# Patient Record
Sex: Male | Born: 1980 | Race: Asian | Hispanic: No | Marital: Married | Smoking: Never smoker
Health system: Southern US, Community
[De-identification: ages and names within clinical notes are randomized; demographics above are authoritative.]

## PROBLEM LIST (undated history)

## (undated) ENCOUNTER — Emergency Department (HOSPITAL_COMMUNITY): Admission: EM | Disposition: A | Discharge status: Home / Self Care | Payer: 59 | Source: Home / Self Care

## (undated) DIAGNOSIS — I471 Supraventricular tachycardia, unspecified: Secondary | ICD-10-CM

## (undated) DIAGNOSIS — Z87898 Personal history of other specified conditions: Secondary | ICD-10-CM

## (undated) HISTORY — DX: Personal history of other specified conditions: Z87.898

## (undated) HISTORY — DX: Supraventricular tachycardia: I47.1

## (undated) HISTORY — PX: LUNG SURGERY: SHX703

## (undated) HISTORY — DX: Supraventricular tachycardia, unspecified: I47.10

---

## 2013-09-19 ENCOUNTER — Ambulatory Visit (INDEPENDENT_AMBULATORY_CARE_PROVIDER_SITE_OTHER): Payer: BC Managed Care – PPO | Admitting: Emergency Medicine

## 2013-09-19 VITALS — BP 122/64 | HR 111 | Temp 101.0°F | Resp 16 | Ht 65.5 in | Wt 181.2 lb

## 2013-09-19 DIAGNOSIS — B019 Varicella without complication: Secondary | ICD-10-CM

## 2013-09-19 MED ORDER — VALACYCLOVIR HCL 1 G PO TABS: 1000.0000 mg | ORAL_TABLET | Freq: Three times a day (TID) | ORAL | Status: DC

## 2013-09-19 NOTE — Patient Instructions (Signed)
B?nh Th?y ??u (Chickenpox [Varicella]) B?nh th?y ?u (Varicella) l b?nh nhi?m vi rt, ph? bi?n h?n ? tr? em. B?nh ny c xu h??ng l ?m nh? v?i h?u h?t tr? em kh?e m?nh. N c th? n?ng h?n ?:   Ng??i l?n.  Tr? s? sinh.  Nh?ng ng??i c v?n ?? ? h? th?ng mi?n d?ch.  Ng??i ?ang ?i?u tr? b?nh ung th?. NGUYN NHN Th?y ??u do m?t lo?i vi rt c tn Vi rt Varicella Zoster (VZV) gy ra. VZV gy ra c? hai b?nh th?y ??u v b?nh zona. ? b? th?y ??u, m?t ng??i d? b? nhi?m b?nh (c th? b? nhi?m trng) ph?i ti?p xc v?i m?t ai ? b? th?y ??u ho?c zona. M?t ng??i d? b? nhi?m b?nh n?u:  H? ch?a b? nhi?m b?nh tr??c ?.  H? ch?a ???c ch?ng ng?a ch?ng VZV.  Vi?c ch?ng ng?a khng b?o v? kh?i b? VZV hon ton (th?y ??u ti?n tri?n). Th?y ??u r?t d? ly. N c th? ly nhi?m 1 ??n 2 ngy tr?c khi xu?t hi?n ban. N c?ng c th? ly nhi?m cho ??n khi cc m?n n??c ?ng v?y. M?n n??c th??ng ?ng v?y 3 ?n 7 ngy sau khi pht ban b?t ??u. Th??ng m?t kho?ng 2 tu?n tr??c khi cc tri?u ch?ng xu?t hi?n. TRI?U CH?NG Tri?u ch?ng ?i?n hnh c?a th?y ??u g?m:  S?t.  au ??u.  ?n khng ngon.  Pht ban ng?a thay ??i theo th?i gian:  B?nh b?t ??u b?ng nh?ng ch?m mu ??, sau ? tr? thnh cc c?c.  Cc c?c chuy?n thnh m?n n??c.  M?n n??c chuy?n thnh v?y. Th?y d?u ti?n tri?n x?y ra khi m?t ng??i ? ???c ch?ng ng?a v?n b? b?nh th?y ??u. Tri?u ch?ng t nghim tr?ng h?n. Pht ban c th? ch? l nh?ng ??m ? ho?c c?c, khng c m?n n??c ho?c ?ng v?y. C th? s?t ? nhi?t ?? th?p ho?c khng b? s?t. CH?N ON Th?y ??u ?i?n hnh ???c ch?n ?on b?ng khm th?c th?. M?t xt nghi?m mu c th? xc nh?n ch?n ?on, khi khng ch?c ch?n v? b?nh. I?U TR? a ph?n, ? tr? em kh?e m?nh, ch? c?n ?i?u tr? t?i nh. Trong m?t s? tr??ng h?p, ? giai ?o?n ??u c?a b?nh th?y ??u, chuyn gia chm [place] s?c kh?e c th? k thu?c khng vi rt. Nh?ng lo?i thu?c ny c th? lm gi?m m?c ?? n?ng c?a b?nh v ng?n ng?a bi?n ch?ng. Trong tr??ng h?p ph?c t?p  hi?m g?p, c th? c?n n?m vi?n ?? ?i?u tr?. Thu?c truy?n qua t?nh m?ch (IV) v cc ph??ng php ?i?u tr? khc c th? ???c cho dng trong b?nh vi?n. Chuyn gia ch?m [place] s?c kh?e c th? k ??n thu?c gi?m ng?a. ? ng?n ch?n s? ly lan c?a b?nh th?y ??u sang nh?ng ng??i c nguy c?, chuyn gia ch?m [place] y t? c th? k ??n:  Ch?ng ng?a.  Thu?c khng vi rt.  Globulinmi?n d?ch H??NG D?N CH?M [place] T?I NH  ??i v?i s?t:  Khng cho tr? em dng aspirin. Thu?c ny c th? lm t?n th??ng no v gan qua h?i ch?ng [place]. Hy ??c nhn thu?c khng c?n k toa ???c s? d?ng.  Ch? dng cc thu?c khng c?n k toa ho?c c?n k toa ?? gi?m ?au, gi?m c?m gic kh ch?u, ho?c gi?m s?t theo nh? h??ng d?n c?a chuyn gia ch?m [place] s?c kh?e.  ?i v?i ng?a:  N?u chuyn gia ch?m [place] s?c kh?e k thu?c, hy s?  d?ng thu?c theo ch? d?n.  B?n c th? s? d?ng kem d??ng da [place] thng th??ng trn cc v?t lot c ng?a. Th?c hi?n theo cc h??ng d?n trn nhn thu?c. Khng s? d?ng cho v?t lot trong mi?ng.  Trnh gi ln cc n?t pht ban ho?c bc v?y. C?t ng?n mng tay v gi? mng tay s?ch s?. i g?ng tay b?ng v?i bng ho?c t?t chn vo bn tay c?a con b?n vo ban ?m.  Gi? tr? b? th?y ??u ? n?i yn t?nh v mt m?. M? hi v qu nng lm cho ng?a t?i t? h?n. Trnh xa m?t tr?i.  C th? ch??m mt ln ch? ng?a.  T?m n??c mt c s?a bicacbonat hay x bng b?t y?n m?ch c th? gip c?i thi?n v?n ??.  ?i v?i nh?ng ch? lot trong mi?ng, thu?c gi?m ?au v th?c ?n l?nh, kem tri cy, c th? t?o c?m gic ?? h?n.  U?ng nhi?u n??c. Trnh ?? u?ng m?n ho?c chua (c chua ho?c n??c cam). Nh?ng ch?t ny s? kch thch ch? lot mi?ng v gy ?au.  Ng??i b? th?y ??u nn trnh ti?p xc (? chung phng) v?i:  Ph? n? mang thai (??c bi?t l n?u h? ch?a b? th?y ??u ho?c ch?a ???c ch?ng ng?a th?y ??u).  Tr? s? sinh.  Nh?ng ng??i ?ang ?i?u tr? ung th? ho?c s? d?ng steroid di h?n.  Nh?ng ng??i c v?n ?? v? h? th?ng mi?n d?ch.  Nh?ng ng??i cao  tu?i.  B?t k? tr? em ho?c ng??i l?n no b? th?y ??u c?ng ??u nn ? nh cho ??n khi t?t c? m?n n??c ? ?ng v?y. N?u khng c m?n n??c, tr? em ho?c ng??i l?n nn ? nh cho ??n khi khng c cc ??m m?i xu?t hi?n. HY ?I KHM N?U:  Nhi?t ?? ?o ? mi?ng trn 38,9 C (102 F).  Tr? h?n 3 thng tu?i c nhi?t ?? ?o ? h?u mn l 100,5 F (38,1 C) tr? ln trong h?n 1 ngy.  Cc v?t lot b? nhi?m trng. Tm ch? b?:  S?ng t?y.  T?y ?? nhi?u h?n.  S?c ??.  Nh?y c?m ?au.  M? mu vng ho?c mu xanh l cy ch?y ra t? m?n n??c.  Ho.  Cc tri?u ch?ng m?i pht tri?n khi?n b?n c?m th?y lo ng?i. HY ?I KHM B?NH NGAY L?P T?C N?U: B?n ho?c con b?n b?:  Nn.  L l?n, bu?n ng? b?t th??ng ho?c c hnh vi l?.  C?ng c?.  ?ng kinh (co gi?t).  M?t th?ng b?ng.  au ng?c.  Kh th? ho?c th? nhanh.  Mu trong n??c ti?u.  Ch?y mu tr?c trng.  B?m tm ? da ho?c ch?y mu trong cc m?n n??c.  M?n n??c trong m?t.  au m?t, t?y ?? ho?c gi?m th? l?c.  Nhi?t ?? ?o ? mi?ng trn 38,9 C (102 F), khng gi?m sau khi dng thu?c.  Tr? h?n 3 thng tu?i c nhi?t ?? ?o ? h?u mn l 102 F (38,9 C) tr? ln.  Tr? 3 thng tu?i ho?c nh? h?n c nhi?t ?? ?o ? h?u mn l 100,4 F (38 C) tr? ln. HY CH?C CH?N R?NG B?N:  Hi?u r nh?ng h??ng d?n khi xu?t vi?n.  S? theo di tnh tr?ng b?nh c?a b?n.  S? yu c?u tr? gip n?u b?n khng ?? ho?c tnh tr?ng tr?m tr?ng h?n. Document Released: 04/22/2005 Document Revised: 03/15/2013 [facility] Patient Information 2014 [place], [place].

## 2013-09-19 NOTE — Progress Notes (Signed)
[facility] [address], [place] [id number] [phone]  Date:  09/19/2013   Name:  Richard Meza   DOB:  [birth year]   MRN:  [id number]  PCP:  No primary provider on file.    Chief Complaint: Rash and Fever   History of Present Illness:  Richard Dalia HeadingBuon Meza is a 33 y.o. very pleasant male patient who presents with the following:  Ill since yesterday with fever and headache.  Has rash that has developed today on the trunk and spread to the upper extremities.  Is pruritic.  No cough or coryza.  No nausea or vomiting.  No stool change.  No improvement with over the counter medications or other home remedies. Denies other complaint or health concern today.   There are no active problems to display for this patient.   No past medical history on file.  No past surgical history on file.  History  Substance Use Topics  . Smoking status: Never Smoker   . Smokeless tobacco: Not on file  . Alcohol Use: Not on file    No family history on file.  No Known Allergies  Medication list has been reviewed and updated.  No current outpatient prescriptions on file prior to visit.   No current facility-administered medications on file prior to visit.    Review of Systems:  As per HPI, otherwise negative.    Physical Examination: Filed Vitals:   09/19/13 1010  BP: 122/64  Pulse: 111  Temp: 101 F (38.3 C)  Resp: 16   Filed Vitals:   09/19/13 1010  Height: 5' 5.5" (1.664 m)  Weight: 181 lb 3.2 oz (82.192 kg)   Body mass index is 29.68 kg/(m^2). Ideal Body Weight: Weight in (lb) to have BMI = 25: 152.2  GEN: WDWN, NAD, Non-toxic, A & O x 3 HEENT: Atraumatic, Normocephalic. Neck supple. No masses, No LAD. Ears and Nose: No external deformity. CV: RRR, No M/G/R. No JVD. No thrill. No extra heart sounds. PULM: CTA B, no wheezes, crackles, rhonchi. No retractions. No resp. distress. No accessory muscle use. ABD: S, NT, ND, +BS. No rebound. No HSM. EXTR: No  c/c/e NEURO Normal gait.  PSYCH: Normally interactive. Conversant. Not depressed or anxious appearing.  Calm demeanor.  SKIN:  Erythematous rash disseminated on upper extremities and trunk with two apparent generations of lesions; erythematous papules and some vesicles with central umbilications.    Assessment and Plan: Varicella Valtrex NO ASPIRIN  Signed,  Phillips OdorJeffery Keishawn Darsey, MD

## 2013-09-24 ENCOUNTER — Other Ambulatory Visit: Payer: Self-pay | Admitting: Emergency Medicine

## 2013-09-28 ENCOUNTER — Ambulatory Visit (INDEPENDENT_AMBULATORY_CARE_PROVIDER_SITE_OTHER): Payer: BC Managed Care – PPO | Admitting: Family Medicine

## 2013-09-28 VITALS — BP 124/60 | HR 61 | Temp 97.6°F | Resp 16 | Ht 66.0 in | Wt 182.4 lb

## 2013-09-28 DIAGNOSIS — R21 Rash and other nonspecific skin eruption: Secondary | ICD-10-CM

## 2013-09-28 NOTE — Progress Notes (Signed)
Subjective: 33 year old man who is here with a rash last week it was diagnosed as being chickenpox. [place] He works for [employer]. Although he been told he could return to work and was okay with his boss, his HR person wanted him to have complete clearance before he could return. He is doing much better and the rash has improved considerably with mild itching now. He did have initial fever with it apparently. Dr. Dareen PianoAnderson had seen him and diagnosed to chickenpox.  Objective: He has a residual of the rash on his truck. He has multiple little excoriated areas. He apparently had some on his legs. He had a moderate number on his arms. The scars did not look like the classic signs of chickenpox, but it certainly sounds like he may of had a true chickenpox because of the way they're described. He has some dry scabs still present, very small, many on the trunk, all under her his clothing.  Assessment: Rash from chickenpox, resolved  Plan: Letter of return to work.

## 2013-09-28 NOTE — Patient Instructions (Signed)
Return to regular duty.  If any rash persists please return

## 2013-10-10 ENCOUNTER — Ambulatory Visit: Payer: BC Managed Care – PPO | Admitting: Family Medicine

## 2013-10-10 VITALS — BP 122/68 | HR 86 | Temp 97.9°F | Resp 18 | Ht 65.0 in | Wt 181.2 lb

## 2013-10-10 DIAGNOSIS — H1045 Other chronic allergic conjunctivitis: Secondary | ICD-10-CM

## 2013-10-10 DIAGNOSIS — H1010 Acute atopic conjunctivitis, unspecified eye: Secondary | ICD-10-CM

## 2013-10-10 MED ORDER — OLOPATADINE HCL 0.1 % OP SOLN
1.0000 [drp] | Freq: Two times a day (BID) | OPHTHALMIC | Status: DC
Start: 2013-10-10 — End: 2014-09-19

## 2013-10-10 NOTE — Patient Instructions (Signed)
Sometimes, alavert or zyrtec helps as well   Allergic Conjunctivitis The conjunctiva is a thin membrane that covers the visible white part of the eyeball and the underside of the eyelids. This membrane protects and lubricates the eye. The membrane has small blood vessels running through it that can normally be seen. When the conjunctiva becomes inflamed, the condition is called conjunctivitis. In response to the inflammation, the conjunctival blood vessels become swollen. The swelling results in redness in the normally white part of the eye. The blood vessels of this membrane also react when a person has allergies and is then called allergic conjunctivitis. This condition usually lasts for as long as the allergy persists. Allergic conjunctivitis cannot be passed to another person (non-contagious). The likelihood of bacterial infection is great and the cause is not likely due to allergies if the inflamed eye has:  A sticky discharge.  Discharge or sticking together of the lids in the morning.  Scaling or flaking of the eyelids where the eyelashes come out.  Red swollen eyelids. CAUSES   Viruses.  Irritants such as foreign bodies.  Chemicals.  General allergic reactions.  Inflammation or serious diseases in the inside or the outside of the eye or the orbit (the boney cavity in which the eye sits) can cause a "red eye." SYMPTOMS   Eye redness.  Tearing.  Itchy eyes.  Burning feeling in the eyes.  Clear drainage from the eye.  Allergic reaction due to pollens or ragweed sensitivity. Seasonal allergic conjunctivitis is frequent in the spring when pollens are in the air and in the fall. DIAGNOSIS  This condition, in its many forms, is usually diagnosed based on the history and an ophthalmological exam. It usually involves both eyes. If your eyes react at the same time every year, allergies may be the cause. While most "red eyes" are due to allergy or an infection, the role of an eye  (ophthalmological) exam is important. The exam can rule out serious diseases of the eye or orbit. TREATMENT   Non-antibiotic eye drops, ointments, or medications by mouth may be prescribed if the ophthalmologist is sure the conjunctivitis is due to allergies alone.  Over-the-counter drops and ointments for allergic symptoms should be used only after other causes of conjunctivitis have been ruled out, or as your caregiver suggests. Medications by mouth are often prescribed if other allergy-related symptoms are present. If the ophthalmologist is sure that the conjunctivitis is due to allergies alone, treatment is normally limited to drops or ointments to reduce itching and burning. HOME CARE INSTRUCTIONS   Wash hands before and after applying drops or ointments, or touching the inflamed eye(s) or eyelids.  Do not let the eye dropper tip or ointment tube touch the eyelid when putting medicine in your eye.  Stop using your soft contact lenses and throw them away. Use a new pair of lenses when recovery is complete. You should run through sterilizing cycles at least three times before use after complete recovery if the old soft contact lenses are to be used. Hard contact lenses should be stopped. They need to be thoroughly sterilized before use after recovery.  Itching and burning eyes due to allergies is often relieved by using a cool cloth applied to closed eye(s). SEEK MEDICAL CARE IF:   Your problems do not go away after two or three days of treatment.  Your lids are sticky (especially in the morning when you wake up) or stick together.  Discharge develops. Antibiotics may be needed  either as drops, ointment, or by mouth.  You have extreme light sensitivity.  An oral temperature above 102 F (38.9 C) develops.  Pain in or around the eye or any other visual symptom develops. MAKE SURE YOU:   Understand these instructions.  Will watch your condition.  Will get help right away if you  are not doing well or get worse. Document Released: 10/03/2002 Document Revised: 10/05/2011 Document Reviewed: 08/29/2007 [facility] Patient Information 2014 [place]ExitCare, [place]LLC.

## 2013-10-10 NOTE — Progress Notes (Signed)
A 33 year old man who works for [employer] and has a recurrence of itchy reddened eyes the last week. At the same thing the last couple years at this time of year. He's had no discharge or pain, vision is slightly blurry.  Objective: No acute distress EOMI PERRLA  Eyes injected with normal fundi, normal lids which are without exudate or significant swelling.  Assessment: Symptoms and history are consistent with allergic conjunctivitis.  Plan:Conjunctivitis, allergic - Plan: olopatadine (PATANOL) 0.1 % ophthalmic solution  Signed, Elvina SidleKurt Indalecio Malmstrom, MD

## 2013-10-17 ENCOUNTER — Telehealth: Payer: Self-pay | Admitting: Emergency Medicine

## 2013-10-17 NOTE — Telephone Encounter (Signed)
Patient dropped off FMLA forms on 10/17/2013. I spoke with patient to see what all is needed; placed forms in Dr. Ewell PoeAnderson's box on same day.

## 2013-10-25 NOTE — Telephone Encounter (Signed)
Patient called to check the status of his FMLA ppw. See previous message.

## 2014-09-09 ENCOUNTER — Emergency Department (HOSPITAL_COMMUNITY)
Admission: EM | Admit: 2014-09-09 | Discharge: 2014-09-09 | Disposition: A | Discharge status: Home / Self Care | Payer: BLUE CROSS/BLUE SHIELD | Attending: Emergency Medicine | Admitting: Emergency Medicine

## 2014-09-09 ENCOUNTER — Encounter (HOSPITAL_COMMUNITY): Payer: Self-pay | Admitting: Emergency Medicine

## 2014-09-09 ENCOUNTER — Emergency Department (HOSPITAL_COMMUNITY): Payer: BLUE CROSS/BLUE SHIELD

## 2014-09-09 DIAGNOSIS — R002 Palpitations: Secondary | ICD-10-CM | POA: Diagnosis not present

## 2014-09-09 DIAGNOSIS — I440 Atrioventricular block, first degree: Secondary | ICD-10-CM

## 2014-09-09 DIAGNOSIS — R000 Tachycardia, unspecified: Secondary | ICD-10-CM | POA: Insufficient documentation

## 2014-09-09 DIAGNOSIS — R0789 Other chest pain: Secondary | ICD-10-CM | POA: Insufficient documentation

## 2014-09-09 DIAGNOSIS — R7989 Other specified abnormal findings of blood chemistry: Secondary | ICD-10-CM

## 2014-09-09 DIAGNOSIS — R079 Chest pain, unspecified: Secondary | ICD-10-CM | POA: Diagnosis present

## 2014-09-09 DIAGNOSIS — R791 Abnormal coagulation profile: Secondary | ICD-10-CM | POA: Diagnosis not present

## 2014-09-09 LAB — CBC
HCT: 45.3 % (ref 39.0–52.0)
Hemoglobin: 15 g/dL (ref 13.0–17.0)
MCH: 24.1 pg — AB (ref 26.0–34.0)
MCHC: 33.1 g/dL (ref 30.0–36.0)
MCV: 72.8 fL — ABNORMAL LOW (ref 78.0–100.0)
Platelets: 329 10*3/uL (ref 150–400)
RBC: 6.22 MIL/uL — ABNORMAL HIGH (ref 4.22–5.81)
RDW: 13.5 % (ref 11.5–15.5)
WBC: 10.1 10*3/uL (ref 4.0–10.5)

## 2014-09-09 LAB — I-STAT TROPONIN, ED: TROPONIN I, POC: 0 ng/mL (ref 0.00–0.08)

## 2014-09-09 LAB — D-DIMER, QUANTITATIVE: D-Dimer, Quant: 0.72 ug/mL-FEU — ABNORMAL HIGH (ref 0.00–0.48)

## 2014-09-09 LAB — BASIC METABOLIC PANEL
Anion gap: 7 (ref 5–15)
BUN: 16 mg/dL (ref 6–23)
CHLORIDE: 105 mmol/L (ref 96–112)
CO2: 25 mmol/L (ref 19–32)
Calcium: 9.6 mg/dL (ref 8.4–10.5)
Creatinine, Ser: 1.08 mg/dL (ref 0.50–1.35)
GFR calc Af Amer: >90 mL/min (ref >90)
GFR, EST NON AFRICAN AMERICAN: 88 mL/min — AB (ref >90)
GLUCOSE: 101 mg/dL — AB (ref 70–99)
Potassium: 4.3 mmol/L (ref 3.5–5.1)
SODIUM: 137 mmol/L (ref 135–145)

## 2014-09-09 MED ORDER — SODIUM CHLORIDE 0.9 % IV BOLUS (SEPSIS)
1000.0000 mL | Freq: Once | INTRAVENOUS | Status: AC
  Administered 2014-09-09: 1000 mL via INTRAVENOUS

## 2014-09-09 MED ORDER — IOHEXOL 350 MG/ML SOLN
80.0000 mL | Freq: Once | INTRAVENOUS | Status: AC | PRN
  Administered 2014-09-09: 64 mL via INTRAVENOUS

## 2014-09-09 MED ORDER — ADENOSINE 6 MG/2ML IV SOLN
12.0000 mg | Freq: Once | INTRAVENOUS | Status: DC
  Filled 2014-09-09: qty 4

## 2014-09-09 MED ORDER — ASPIRIN 81 MG PO CHEW
324.0000 mg | CHEWABLE_TABLET | Freq: Once | ORAL | Status: AC
  Administered 2014-09-09: 324 mg via ORAL
  Filled 2014-09-09: qty 4

## 2014-09-09 NOTE — ED Notes (Signed)
Pt c/o chest pain onset Thursday with shortness of breath and diaphoresis. [facility] and sent here for further eval.

## 2014-09-09 NOTE — Consult Note (Signed)
CARDIOLOGY CONSULT NOTE  Patient ID: Richard Meza, MRN: [id number], DOB/AGE: November 23, 1980 34 y.o. Admit date: 09/09/2014 Date of Consult: 09/09/2014  Primary Physician: No primary care provider on file. Primary Cardiologist: unassigned  Chief Complaint: shortness of breath Reason for Consultation: arrhythmia  HPI: 34 y.o. male w/ PMHx significant for history of what sounds like a traumatic pleural effusion s/p thoracentesis/thoracotomy 2006 but otherwise healthy [place] male who presented to [facility] on 09/09[facility]2014 with complaints of several episodes of acute shortness of breath. No recent health issues or illnesses. This past Thursday, he noted acute onset of shortness of breath and dyspnea that caused him to take shallow breathes. Occurred while he was getting up and walking to fridge. He doesn't really endorse chest pain; the shortness of breath and difficulty taking full breaths was most notable. No syncope or presyncope. This was his longest episode and it resolved after abuot 10 minutes. Didn't really notice his heart beating fast. He tried drinking water, soda, and lemon juice in an effort to help. Did not try or know to try vagal manuevers. Since then, has had several more episodes, lasting anywhere from 1-5 minutes, self resolve on their own. Again, no syncope, presyncope.    Never had heart problems in the past. About 10 yrs ago in [place] underwent thoracentesis for what he says was a traumatic injury to his chest resulting in a thoracentesis (clear fluid though but this failed) and then had to have a thoracotomy and has the scar to prove it.  Went to [place] urgent care today and had a episode while on monitor, was going 150 bpm. Transferred to [place] and had narrow complex tachycardia at 135 on EKG. Appears to have P wave buried in ST wave, consistent with short RP. On telemetry, his heart rate slowly improved (over 10 minutes or so) to current rate of 80s. No intervention  except 1 L NS.   Current EKG shows a P wave consistent with sinus though with PR interval of 285 msec. Prominent R wave in V2 with a RSR' morphology but no ST segment elevation.   Currently asymptomatic. Denies any LE swelling, PND, orthopnea. No history of heart problems as child. No illicits or OTC meds. Works as Production designer, theatre/television/film man at [employer]   Had elevated D-dimer which led to a CT scan which demonstrated no PE, no dissection and borderline large LV.    Surgical History:  Past Surgical History  Procedure Laterality Date  . Lung surgery     ?traumatic pleural effusion s/p thoracotomy ~2005  Home Meds: Prior to Admission medications   Not on File    Allergies: No Known Allergies  History   Social History  . Marital Status: Married    Spouse Name: N/A  . Number of Children: N/A  . Years of Education: N/A   Occupational History  . Not on file.   Social History Main Topics  . Smoking status: Never Smoker   . Smokeless tobacco: Not on file  . Alcohol Use: No  . Drug Use: No  . Sexual Activity: Not on file   Other Topics Concern  . Not on file   Social History Narrative  . No narrative on file     No family history on file.   Review of Systems: General: negative for chills, fever, night sweats or weight changes.  Cardiovascular: see HPI Dermatological: negative for rash Respiratory: negative for cough or wheezing Urologic: negative for hematuria Abdominal: negative for nausea, vomiting, diarrhea,  bright red blood per rectum, melena, or hematemesis Neurologic: negative for visual changes, syncope, or dizziness All other systems reviewed and are otherwise negative except as noted above.  Labs: No results for input(s): CKTOTAL, CKMB, TROPONINI in the last 72 hours. Lab Results  Component Value Date   WBC 10.1 09/09/2014   HGB 15.0 09/09/2014   HCT 45.3 09/09/2014   MCV 72.8* 09/09/2014   PLT 329 09/09/2014    Recent Labs Lab 09/09/14 1521  NA 137   K 4.3  CL 105  CO2 25  BUN 16  CREATININE 1.08  CALCIUM 9.6  GLUCOSE 101*   No results found for: CHOL, HDL, LDLCALC, TRIG Lab Results  Component Value Date   DDIMER 0.72* 09/09/2014    Radiology/Studies:  Ct Angio Chest Pe W/cm &/or Wo Cm  09/09/2014   CLINICAL DATA:  Chest pain for 4 days with shortness of breath and diaphoresis.  EXAM: CT ANGIOGRAPHY CHEST WITH CONTRAST  TECHNIQUE: Multidetector CT imaging of the chest was performed using the standard protocol during bolus administration of intravenous contrast. Multiplanar CT image reconstructions and MIPs were obtained to evaluate the vascular anatomy.  CONTRAST:  64 mL OMNIPAQUE IOHEXOL 350 MG/ML SOLN  COMPARISON:  None.  FINDINGS: No pulmonary embolus is identified. No pleural or pericardial effusion. There is no axillary, hilar or mediastinal lymphadenopathy. Heart size appears enlarged. The lungs are clear. Visualized upper abdomen is unremarkable. No focal bony abnormality is identified.  Review of the MIP images confirms the above findings.  IMPRESSION: Negative for pulmonary embolus or acute disease.  Mild cardiomegaly.   Electronically Signed   By: Drusilla Kanner M.D.   On: 09/09/2014 18:37    EKG: see HPI for description  Physical Exam: Blood pressure 102/60, pulse 99, temperature 98.5 F (36.9 C), temperature source Oral, resp. rate 20, height  (1.651 m), weight 83.915 kg (185 lb), SpO2 97 %. General: Well developed, well nourished, in no acute distress. Head: Normocephalic, atraumatic, sclera non-icteric, no xanthomas, nares are without discharge.  Neck: Supple. Negative for carotid bruits. JVD not elevated. Lungs: Clear bilaterally to auscultation without wheezes, rales, or rhonchi. Breathing is unlabored. Heart: RRR with S1 S2. No murmurs, rubs, or gallops appreciated. Abdomen: Soft, non-tender, non-distended with normoactive bowel sounds. No hepatomegaly. No rebound/guarding. No obvious abdominal masses. Msk:   Strength and tone appear normal for age. Extremities: No clubbing or cyanosis. No edema.  Distal pedal pulses are 2+ and equal bilaterally. Neuro: Alert and oriented X 3. Moves all extremities spontaneously. Psych:  Responds to questions appropriately with a normal affect.    Problem List 1. Narrow complex tachycardia, self resolved to 1st deg AVB 2. Borderline LV enlargement on CT   Assessment and Plan:  34 y.o. male w/ PMHx significant for history of what sounds like a traumatic pleural effusion s/p thoracentesis/thoracotomy 2006 but otherwise healthy [place] male who presented to [facility] on 09/09/2014 with complaints of several episodes of acute shortness of breath, found to be in narrow complex tachycardia that resolved without intervention.  Interesting case for several reasons: he describes sudden onset of symptoms with subsequent relief of symptoms after short period of time. This is c/w with your typical "SVT" of AVNRT or AVRT but telemetry demonstrates more of a gradual resolution of tachycardia and his final EKG demonstrates a rather significant 1st deg AVB (and the P waves appear consistent with sinus origin). This gradual resolution suggests more of a sinus tach or atrial tach  instead of AVNRT or AVRT.   Encouragingly, he is not having any red flag symptoms of chest pain (or evidence of ischemia by EKG or troponin), no CHF symptoms (noted borderline LV enlargement on echo,, no syncope or presyncope and the symptoms self resolve after short period. Though he requires further workup (echo mainly at this time) and potential treatment, this can be deferred to the outpatient clinic and we will try and arrange this for this week. Indications to urgently return were discussed with the patient and his wife who verbalized understanding. Also recommended avoiding driving until seen in clinic.  Due to the prolonged PR, will avoid treating with nodal blockers at this time.   Spoke  with ER staff (and EP by phone) who agreed with plan.  Signed, Adolm JosephWHITLOCK, Troy SineMATTHEW C. MD 09/09/2014, 8:20 PM

## 2014-09-09 NOTE — Discharge Instructions (Signed)
1. Medications: usual home medications 2. Treatment: rest, drink plenty of fluids, no driving until evaluated by cardiology who will call you to scheduled an appointment 3. Follow Up: Please followup with cardiology as directed; Please return to the ER for syncope, worsening chest pain or other concerns

## 2014-09-09 NOTE — ED Provider Notes (Signed)
Complains of anterior chest pain left-sided parasternal nonradiating onset 3 days ago last 10 minutes of the time not made worse or made better by anything presently pain free denies shortness of breath no nausea no sweatiness no treatment prior to coming here. On exam no distress lungs clear to auscultation heart tachycardic regular rhythm abdomen nondistended nontender extremities without edema  Doug SouSam Cierah Crader, MD 09/10/14 [id number]

## 2014-09-09 NOTE — ED Notes (Signed)
Cardiology at bedside.

## 2014-09-09 NOTE — ED Provider Notes (Signed)
CSN: [id number]     Arrival date & time 09/09/14  1515 History   First MD Initiated Contact with Patient 09/09/14 1527     Chief Complaint  Patient presents with  . Chest Pain     (Consider location/radiation/quality/duration/timing/severity/associated sxs/prior Treatment) HPI   Richard Meza is a 34 y.o. male  with no major medical history presents to the Emergency Department complaining of waxing and waning substernal chest pain beginning 3 days ago.  Patient reports that severe episodes occur 2-3 times per day lasting 1-5 minutes with radiation to the left shoulder and back. No aggravating or alleviating factors noted. He reports that during this time he has palpitations. At this moment he has chest pain without palpitations, nausea, diaphoresis.   No treatments prior to arrival.  Patient denies recent travel or recent surgery.   Patient denies associated shortness of breath, fevers, changes in skin or hair. He denies usage, smoking and drug usage. He reports minimal caffeine usage and did not drink any coffee today. He denies diagnosis of thyroid problems.   History reviewed. No pertinent past medical history. Past Surgical History  Procedure Laterality Date  . Lung surgery     No family history on file. History  Substance Use Topics  . Smoking status: Never Smoker   . Smokeless tobacco: Not on file  . Alcohol Use: No    Review of Systems  Constitutional: Negative for fever, diaphoresis, appetite change, fatigue and unexpected weight change.  HENT: Negative for mouth sores.   Eyes: Negative for visual disturbance.  Respiratory: Negative for cough, chest tightness, shortness of breath and wheezing.   Cardiovascular: Positive for chest pain and palpitations.  Gastrointestinal: Negative for nausea, vomiting, abdominal pain, diarrhea and constipation.  Endocrine: Negative for polydipsia, polyphagia and polyuria.  Genitourinary: Negative for dysuria, urgency, frequency and hematuria.   Musculoskeletal: Negative for back pain and neck stiffness.  Skin: Negative for rash.  Allergic/Immunologic: Negative for immunocompromised state.  Neurological: Negative for syncope, light-headedness and headaches.  Hematological: Does not bruise/bleed easily.  Psychiatric/Behavioral: Negative for sleep disturbance. The patient is not nervous/anxious.       Allergies  Review of patient's allergies indicates no known allergies.  Home Medications   Prior to Admission medications   Not on File   BP 102/60 mmHg  Pulse 99  Temp(Src) 98.5 F (36.9 C) (Oral)  Resp 20  Ht  (1.651 m)  Wt 185 lb (83.915 kg)  BMI 30.79 kg/m2  SpO2 97% Physical Exam  Constitutional: He appears well-developed and well-nourished. No distress.  Awake, alert, nontoxic appearance  HENT:  Head: Normocephalic and atraumatic.  Mouth/Throat: Oropharynx is clear and moist. No oropharyngeal exudate.  Eyes: Conjunctivae are normal. No scleral icterus.  Neck: Normal range of motion and full passive range of motion without pain. Neck supple. No thyroid mass and no thyromegaly present.  No thyromegaly or mass  Cardiovascular: Regular rhythm, normal heart sounds and intact distal pulses.   No murmur heard. Tachycardia  Pulmonary/Chest: Effort normal and breath sounds normal. No respiratory distress. He has no wheezes. He exhibits no tenderness.  Equal chest expansion  Abdominal: Soft. Bowel sounds are normal. He exhibits no mass. There is no tenderness. There is no rebound and no guarding.  abd soft and nontender  Musculoskeletal: Normal range of motion. He exhibits no edema or tenderness.  No edema of the lower legs  Neurological: He is alert. Coordination normal.  Speech is clear and goal oriented Moves extremities without  ataxia  Skin: Skin is warm and dry. He is not diaphoretic. No erythema.  Psychiatric: He has a normal mood and affect.  Nursing note and vitals reviewed.   ED Course  Procedures  (including critical care time) Labs Review Labs Reviewed  CBC - Abnormal; Notable for the following:    RBC 6.22 (*)    MCV 72.8 (*)    MCH 24.1 (*)    All other components within normal limits  BASIC METABOLIC PANEL - Abnormal; Notable for the following:    Glucose, Bld 101 (*)    GFR calc non Af Amer 88 (*)    All other components within normal limits  D-DIMER, QUANTITATIVE - Abnormal; Notable for the following:    D-Dimer, Quant 0.72 (*)    All other components within normal limits  I-STAT TROPOININ, ED    Imaging Review Ct Angio Chest Pe W/cm &/or Wo Cm  09/09/2014   CLINICAL DATA:  Chest pain for 4 days with shortness of breath and diaphoresis.  EXAM: CT ANGIOGRAPHY CHEST WITH CONTRAST  TECHNIQUE: Multidetector CT imaging of the chest was performed using the standard protocol during bolus administration of intravenous contrast. Multiplanar CT image reconstructions and MIPs were obtained to evaluate the vascular anatomy.  CONTRAST:  64 mL OMNIPAQUE IOHEXOL 350 MG/ML SOLN  COMPARISON:  None.  FINDINGS: No pulmonary embolus is identified. No pleural or pericardial effusion. There is no axillary, hilar or mediastinal lymphadenopathy. Heart size appears enlarged. The lungs are clear. Visualized upper abdomen is unremarkable. No focal bony abnormality is identified.  Review of the MIP images confirms the above findings.  IMPRESSION: Negative for pulmonary embolus or acute disease.  Mild cardiomegaly.   Electronically Signed   By: Drusilla Kanner M.D.   On: 09/09/2014 18:37     EKG Interpretation   Date/Time:  Sunday September 09 2014 15:19:29 EST Ventricular Rate:  135 PR Interval:    QRS Duration: 102 QT Interval:  312 QTC Calculation: 468 R Axis:   53 Text Interpretation:  Supraventricular tachycardia Otherwise normal ECG No  old tracing to compare Confirmed by Ethelda Chick  MD, SAM [phone]) on  09/09/2014 3:24:20 PM      EKG Interpretation  Date/Time:  Sunday September 09 2014  16:34:33 EST Ventricular Rate:  107 PR Interval:    QRS Duration: 103 QT Interval:  357 QTC Calculation: 476 R Axis:   23 Text Interpretation:  Age not entered, assumed to be  34 years old for purpose of ECG interpretation Junctional tachycardia Consider inferior infarct Baseline wander in lead(s) V5 Since last tracing rate slower Confirmed by Ethelda Chick  MD, SAM [phone]) on 09/09/2014 4:44:10 PM        MDM   Final diagnoses:  Chest pain  Tachycardia  Elevated d-dimer  Prolonged P-R interval   Rivka Spring presents from [facility] with tachycardia and chest pain.  She arrives with a heart rate of 136.  No risk factors for DVT or PE, will check d-dimer. Patient given aspirin and labs pending. Will give adenosine.  3:59 PM Recheck and pt with HR of 116.    The patient was discussed with and seen by Dr. Ethelda Chick who agrees with the treatment plan.  4:45 PM   Repeat EKG with improved heart rate but evidence of accelerated junctional rhythm. Pending d-dimer.  5:05PM Positive d-dimer, will obtain CT angio chest.    6:45 PM CT chest without evidence of primary embolism. Patient now with heart rate in the 90s.  EKG now with first-degree AV block. Will consult cardiology.  7:05 PM Patient remains chest pain-free. Discussed with Dr. Adolm JosephWhitlock of cardiology who will assess. Also discussed with Dr. Drusilla Kannerhomas Dalessio  of radiology who confirms that there is no evidence of aortic dissection on his CT NG of chest.  9:05 PM Patient evaluated by Dr. Adolm JosephWhitlock of cardiology who recommends EP evaluation within the next 3 days. No medications to go home with. Patient is to not drive until evaluated.  He is to return for worsening chest pain, syncope or other concerning factors.  I have personally reviewed patient's vitals, nursing note and any pertinent labs or imaging.  I performed an undressed physical exam.    It has been determined that no acute conditions requiring further emergency  intervention are present at this time. The patient/guardian have been advised of the diagnosis and plan. I reviewed all labs and imaging including any potential incidental findings. We have discussed signs and symptoms that warrant return to the ED and they are listed in the discharge instructions.    Vital signs are stable at discharge.   BP 102/60 mmHg  Pulse 99  Temp(Src) 98.5 F (36.9 C) (Oral)  Resp 20  Ht 5\' 5"  (1.651 m)  Wt 185 lb (83.915 kg)  BMI 30.79 kg/m2  SpO2 97%  The patient was discussed with and seen by Dr. Ethelda ChickJacubowitz who agrees with the treatment plan.   Dahlia ClientHannah Latima Hamza, PA-C 09/09/14 2107  Doug SouSam Jacubowitz, MD 09/10/14 [phone]

## 2014-09-09 NOTE — ED Notes (Addendum)
Pt [facility]  Pt reports the pain has been intermittent since Thursday but when the pain hits he becomes very SHOB and diaphoretic.  Pt reports episodes occur "several times a day"  Pt denies any cardiac history.  Pt is c/o neck pain as well.

## 2014-09-19 ENCOUNTER — Ambulatory Visit (INDEPENDENT_AMBULATORY_CARE_PROVIDER_SITE_OTHER): Payer: BLUE CROSS/BLUE SHIELD | Admitting: Internal Medicine

## 2014-09-19 ENCOUNTER — Encounter: Payer: Self-pay | Admitting: Internal Medicine

## 2014-09-19 VITALS — BP 108/70 | HR 81 | Ht 65.0 in | Wt 188.6 lb

## 2014-09-19 DIAGNOSIS — R011 Cardiac murmur, unspecified: Secondary | ICD-10-CM

## 2014-09-19 DIAGNOSIS — R079 Chest pain, unspecified: Secondary | ICD-10-CM

## 2014-09-19 DIAGNOSIS — I471 Supraventricular tachycardia: Secondary | ICD-10-CM

## 2014-09-19 DIAGNOSIS — R0602 Shortness of breath: Secondary | ICD-10-CM

## 2014-09-19 DIAGNOSIS — E785 Hyperlipidemia, unspecified: Secondary | ICD-10-CM

## 2014-09-19 NOTE — Patient Instructions (Signed)
Your physician recommends that you schedule a follow-up appointment as needed ----will call with results of test   Your physician has requested that you have an exercise tolerance test. For further information please visit [email or web address]. Please also follow instruction sheet, as given.  Your physician has requested that you have an echocardiogram. Echocardiography is a painless test that uses sound waves to create images of your heart. It provides your doctor with information about the size and shape of your heart and how well your heart's chambers and valves are working. This procedure takes approximately one hour. There are no restrictions for this procedure.  Your physician recommends that you return for lab work day of test: Fasting lipid

## 2014-09-19 NOTE — Progress Notes (Signed)
Electrophysiology Office Note   Date:  09/19/2014   ID:  Khameron, Gruenwald [birth year], MRN [id number]  PCP:  none Cardiologist:  none Primary Electrophysiologist: Hillis Range, MD    Chief Complaint  Patient presents with  . Appointment    tachycardia     History of Present Illness: Richard Meza is a 34 y.o. male who presents today for electrophysiology evaluation.   He developed symptoms of chest heaviness and palpitations 09/09/14 for which he presented to [facility] ER.  He was found to have a mid RP tachycardia at that time.  His symptoms resolved in the ER and have not returned.  He has done well since.  Today, he denies symptoms of palpitations, chest pain, shortness of breath, orthopnea, PND, lower extremity edema, claudication, dizziness, presyncope, syncope, bleeding, or neurologic sequela. The patient is tolerating medications without difficulties and is otherwise without complaint today.    PMH- none  Past Surgical History  Procedure Laterality Date  . Lung surgery     No medicines  Allergies:   Review of patient's allergies indicates no known allergies.   Social History:  The patient  reports that he has never smoked. He does not have any smokeless tobacco history on file. He reports that he does not drink alcohol or use illicit drugs.   Family History:  The patient is unaware of any pertinent family history.  His family is in [place].  His father drinks heavily.  He does not have much contact with them.  He is unaware of premature CAD or arrhythmias   ROS:  Please see the history of present illness.   All other systems are reviewed and negative.    PHYSICAL EXAM: VS:  BP 108/70 mmHg  Pulse 81  Ht  (1.651 m)  Wt 188 lb 9.6 oz (85.548 kg)  BMI 31.38 kg/m2 , BMI Body mass index is 31.38 kg/(m^2). GEN: Well nourished, well developed, in no acute distress HEENT: normal Neck: no JVD, carotid bruits, or masses Cardiac: RRR; 2/6 diastolic murmur LLSB,no  edema  Respiratory:  clear to auscultation bilaterally, normal work of breathing GI: soft, nontender, nondistended, + BS MS: no deformity or atrophy Skin: warm and dry  Neuro:  Strength and sensation are intact Psych: euthymic mood, full affect  EKG:  EKG is ordered today. The ekg ordered today shows sinus rhythm 73 bpm, PR 202, QRS 104, Qtc 458   Recent Labs: 09/09/2014: BUN 16; Creatinine 1.08; Hemoglobin 15.0; Platelets 329; Potassium 4.3; Sodium 137    Lipid Panel  No results found for: CHOL, TRIG, HDL, CHOLHDL, VLDL, LDLCALC, LDLDIRECT   Wt Readings from Last 3 Encounters:  09/19/14 188 lb 9.6 oz (85.548 kg)  09/09/14 185 lb (83.915 kg)  10/10/13 181 lb 3.2 oz (82.192 kg)      Other studies Reviewed: Additional studies/ records that were reviewed today include: ekg from 09/09/14 and also ED notes   ASSESSMENT AND PLAN:  1.  Mid RP tachycardia Differential includes sinus with first degree AV block, atrial tachycardia with first degree AV block, and AVRT.  He has done well without any further symptoms of arrhythmia.  EKG today is normal appearing.  I will obtain an echo.  If he has no evidence of structural abnormality then for further workup is planned.  He should contact our office if his symptoms return.  2. Murmur Echo  3. HL Fasting lipids    Current medicines are reviewed at length with the  patient today.   The patient does not have concerns regarding his medicines.  The following changes were made today:  none  If echo is unremarkable, I will see as needed going forward  Signed, Hillis RangeJames Sencere Symonette, MD  09/19/2014 9:24 AM     [facility] [address] [place] [address] [phone] (office) [phone] (fax)

## 2014-09-20 DIAGNOSIS — I471 Supraventricular tachycardia: Secondary | ICD-10-CM | POA: Insufficient documentation

## 2014-09-20 DIAGNOSIS — R011 Cardiac murmur, unspecified: Secondary | ICD-10-CM | POA: Insufficient documentation

## 2014-09-20 DIAGNOSIS — E785 Hyperlipidemia, unspecified: Secondary | ICD-10-CM | POA: Insufficient documentation

## 2014-11-02 ENCOUNTER — Ambulatory Visit (INDEPENDENT_AMBULATORY_CARE_PROVIDER_SITE_OTHER): Payer: BLUE CROSS/BLUE SHIELD | Admitting: Physician Assistant

## 2014-11-02 ENCOUNTER — Ambulatory Visit (HOSPITAL_COMMUNITY): Payer: BLUE CROSS/BLUE SHIELD | Attending: Internal Medicine

## 2014-11-02 ENCOUNTER — Other Ambulatory Visit (INDEPENDENT_AMBULATORY_CARE_PROVIDER_SITE_OTHER): Payer: BLUE CROSS/BLUE SHIELD | Admitting: *Deleted

## 2014-11-02 DIAGNOSIS — R079 Chest pain, unspecified: Secondary | ICD-10-CM

## 2014-11-02 DIAGNOSIS — R0602 Shortness of breath: Secondary | ICD-10-CM

## 2014-11-02 DIAGNOSIS — I351 Nonrheumatic aortic (valve) insufficiency: Secondary | ICD-10-CM | POA: Diagnosis not present

## 2014-11-02 DIAGNOSIS — R011 Cardiac murmur, unspecified: Secondary | ICD-10-CM | POA: Diagnosis not present

## 2014-11-02 LAB — LIPID PANEL
CHOLESTEROL: 191 mg/dL (ref 0–200)
HDL: 37.3 mg/dL — AB (ref >39.00)
NonHDL: 153.7
TRIGLYCERIDES: 239 mg/dL — AB (ref 0.0–149.0)
Total CHOL/HDL Ratio: 5
VLDL: 47.8 mg/dL — ABNORMAL HIGH (ref 0.0–40.0)

## 2014-11-02 LAB — LDL CHOLESTEROL, DIRECT: Direct LDL: 70 mg/dL

## 2014-11-02 NOTE — Progress Notes (Signed)
2D Echo completed. 11/02/2014 

## 2014-11-02 NOTE — Addendum Note (Signed)
Addended by: Tonita PhoenixBOWDEN, ROBIN K on: 11/02/2014 10:03 AM   Modules accepted: Orders

## 2014-11-02 NOTE — Progress Notes (Signed)
Exercise Treadmill Test  Pre-Exercise Testing Evaluation Rhythm: normal sinus  Rate: 62 bpm     Test  Exercise Tolerance Test Ordering MD: Hillis RangeJames Allred, MD  Interpreting MD: Tereso NewcomerScott Weaver, PA-C  Unique Test No: 1  Treadmill:  1  Indication for ETT: chest pain - rule out ischemia  Contraindication to ETT: No   Stress Modality: exercise - treadmill  Cardiac Imaging Performed: non   Protocol: standard Bruce - maximal  Max BP:  220/108  Max MPHR (bpm):  186 85% MPR (bpm):  158  MPHR obtained (bpm):  171 % MPHR obtained:  92  Reached 85% MPHR (min:sec):  9:49 Total Exercise Time (min-sec):  11:45  Workload in METS:  13.1 Borg Scale: 15  Reason ETT Terminated:  exaggerated hypertensive response    ST Segment Analysis At Rest: normal ST segments - no evidence of significant ST depression With Exercise: no evidence of significant ST depression  Other Information Arrhythmia:  No Angina during ETT:  absent (0) Quality of ETT:  diagnostic  ETT Interpretation:  normal - no evidence of ischemia by ST analysis  Comments: Excellent exercise capacity. No chest pain. Normal BP response to exercise.  BP at peak exercise likely artifactual.  No ST changes to suggest ischemia.  Rare PAC, PVC at peak exercise.  Recommendations: FU with Dr. Hillis RangeJames Allred as planned. Signed,  Tereso NewcomerScott Weaver, PA-C   11/02/2014 9:54 AM

## 2014-11-02 NOTE — Addendum Note (Signed)
Addended by: Hobson Lax K on: 11/02/2014 08:45 AM   Modules accepted: Orders  

## 2014-11-14 ENCOUNTER — Telehealth: Payer: Self-pay | Admitting: Internal Medicine

## 2014-11-14 DIAGNOSIS — E785 Hyperlipidemia, unspecified: Secondary | ICD-10-CM

## 2014-11-14 DIAGNOSIS — I351 Nonrheumatic aortic (valve) insufficiency: Secondary | ICD-10-CM

## 2014-11-14 NOTE — Telephone Encounter (Signed)
Follow Up   Pt called to Follow Up on results//sr

## 2014-11-14 NOTE — Telephone Encounter (Signed)
Left message for patient to return my call.

## 2014-11-15 NOTE — Telephone Encounter (Signed)
  Notes Recorded by Hillis RangeJames Allred, MD on 11/02/2014 at 9:55 PM Results reviewed. Please inform pt of result. Elevated triglycerides. Can discuss with general cardiology when he establishes with them.  Notes Recorded by Hillis RangeJames Allred, MD on 11/02/2014 at 9:48 PM Results reviewed. Please inform pt of result. Moderate AI. Please refer to general cardiology to establish for long term follow-up.

## 2014-11-15 NOTE — Telephone Encounter (Signed)
Informed patient of results/KLanier,RN  

## 2014-12-26 ENCOUNTER — Ambulatory Visit: Payer: BLUE CROSS/BLUE SHIELD | Admitting: Cardiovascular Disease

## 2015-02-26 ENCOUNTER — Ambulatory Visit: Payer: BLUE CROSS/BLUE SHIELD | Admitting: Cardiovascular Disease

## 2015-04-13 ENCOUNTER — Encounter (HOSPITAL_COMMUNITY): Payer: Self-pay | Admitting: *Deleted

## 2015-04-13 ENCOUNTER — Emergency Department (HOSPITAL_COMMUNITY): Payer: BLUE CROSS/BLUE SHIELD

## 2015-04-13 ENCOUNTER — Emergency Department (HOSPITAL_COMMUNITY)
Admission: EM | Admit: 2015-04-13 | Discharge: 2015-04-13 | Disposition: A | Discharge status: Home / Self Care | Payer: BLUE CROSS/BLUE SHIELD | Attending: Emergency Medicine | Admitting: Emergency Medicine

## 2015-04-13 DIAGNOSIS — K644 Residual hemorrhoidal skin tags: Secondary | ICD-10-CM | POA: Insufficient documentation

## 2015-04-13 DIAGNOSIS — K6289 Other specified diseases of anus and rectum: Secondary | ICD-10-CM | POA: Diagnosis present

## 2015-04-13 DIAGNOSIS — K611 Rectal abscess: Secondary | ICD-10-CM | POA: Insufficient documentation

## 2015-04-13 LAB — CBC WITH DIFFERENTIAL/PLATELET
Basophils Absolute: 0.1 10*3/uL (ref 0.0–0.1)
Basophils Relative: 1 %
Eosinophils Absolute: 0.6 10*3/uL (ref 0.0–0.7)
Eosinophils Relative: 4 %
HCT: 39.4 % (ref 39.0–52.0)
HEMOGLOBIN: 13.1 g/dL (ref 13.0–17.0)
LYMPHS ABS: 2 10*3/uL (ref 0.7–4.0)
Lymphocytes Relative: 14 %
MCH: 24 pg — ABNORMAL LOW (ref 26.0–34.0)
MCHC: 33.2 g/dL (ref 30.0–36.0)
MCV: 72.3 fL — AB (ref 78.0–100.0)
Monocytes Absolute: 0.8 10*3/uL (ref 0.1–1.0)
Monocytes Relative: 6 %
NEUTROS PCT: 75 %
Neutro Abs: 10.7 10*3/uL — ABNORMAL HIGH (ref 1.7–7.7)
Platelets: 312 10*3/uL (ref 150–400)
RBC: 5.45 MIL/uL (ref 4.22–5.81)
RDW: 13.1 % (ref 11.5–15.5)
WBC: 14.3 10*3/uL — AB (ref 4.0–10.5)

## 2015-04-13 LAB — BASIC METABOLIC PANEL
Anion gap: 6 (ref 5–15)
BUN: 12 mg/dL (ref 6–20)
CHLORIDE: 106 mmol/L (ref 101–111)
CO2: 27 mmol/L (ref 22–32)
Calcium: 9 mg/dL (ref 8.9–10.3)
Creatinine, Ser: 0.96 mg/dL (ref 0.61–1.24)
GFR calc Af Amer: >60 mL/min (ref >60)
GFR calc non Af Amer: >60 mL/min (ref >60)
Glucose, Bld: 71 mg/dL (ref 65–99)
POTASSIUM: 3.5 mmol/L (ref 3.5–5.1)
SODIUM: 139 mmol/L (ref 135–145)

## 2015-04-13 MED ORDER — LIDOCAINE-EPINEPHRINE 1 %-1:100000 IJ SOLN
30.0000 mL | Freq: Once | INTRAMUSCULAR | Status: AC
  Administered 2015-04-13: 30 mL via INTRADERMAL
  Filled 2015-04-13: qty 2

## 2015-04-13 MED ORDER — MORPHINE SULFATE (PF) 4 MG/ML IV SOLN
4.0000 mg | Freq: Once | INTRAVENOUS | Status: AC
  Administered 2015-04-13: 4 mg via INTRAVENOUS
  Filled 2015-04-13: qty 1

## 2015-04-13 MED ORDER — IOHEXOL 300 MG/ML  SOLN
25.0000 mL | Freq: Once | INTRAMUSCULAR | Status: DC | PRN
  Administered 2015-04-13: 25 mL via ORAL
  Filled 2015-04-13: qty 30

## 2015-04-13 MED ORDER — IOHEXOL 300 MG/ML  SOLN
100.0000 mL | Freq: Once | INTRAMUSCULAR | Status: AC | PRN
  Administered 2015-04-13: 100 mL via INTRAVENOUS

## 2015-04-13 MED ORDER — DEXTROSE 5 % IV SOLN
1.0000 g | Freq: Once | INTRAVENOUS | Status: AC
  Administered 2015-04-13: 1 g via INTRAVENOUS
  Filled 2015-04-13: qty 10

## 2015-04-13 MED ORDER — DOCUSATE SODIUM 100 MG PO CAPS: 100.0000 mg | ORAL_CAPSULE | Freq: Two times a day (BID) | ORAL | Status: DC

## 2015-04-13 MED ORDER — HYDROCODONE-ACETAMINOPHEN 5-325 MG PO TABS: 1.0000 | ORAL_TABLET | Freq: Four times a day (QID) | ORAL | Status: DC | PRN

## 2015-04-13 MED ORDER — SULFAMETHOXAZOLE-TRIMETHOPRIM 800-160 MG PO TABS: 1.0000 | ORAL_TABLET | Freq: Two times a day (BID) | ORAL | Status: DC

## 2015-04-13 NOTE — Discharge Instructions (Signed)
Remove packing 9/19 and replace, ok to shower 9/19. Remove packing 9/21 and leave open. Call if fever greater than 101, worsening pain.

## 2015-04-13 NOTE — ED Provider Notes (Signed)
CSN: [id number]     Arrival date & time 04/13/15  1233 History   First MD Initiated Contact with Patient 04/13/15 1613     Chief Complaint  Patient presents with  . Abscess     (Consider location/radiation/quality/duration/timing/severity/associated sxs/prior Treatment) Patient is a 34 y.o. male presenting with abscess. The history is provided by the patient and medical records.  Abscess   34 y.o. M here with rectal pain.  Patient states this began on Monday, has been worsening since this time.  States he does have hx of hemorrhoids, however this feels different.  Pain is sharp, worse with bowel movements.  No melena or hematochezia.  Denies drainage from rectal region.  Endorses subjective fever, has not taken temp.  No chills or diaphoresis.  No hx of DM, HIV, or MRSA.  VSS.  History reviewed. No pertinent past medical history. Past Surgical History  Procedure Laterality Date  . Lung surgery     No family history on file. Social History  Substance Use Topics  . Smoking status: Never Smoker   . Smokeless tobacco: None  . Alcohol Use: No    Review of Systems  Gastrointestinal: Positive for rectal pain.  All other systems reviewed and are negative.     Allergies  Review of patient's allergies indicates no known allergies.  Home Medications   Prior to Admission medications   Not on File   BP 123/87 mmHg  Pulse 73  Temp(Src) 99.2 F (37.3 C) (Oral)  Resp 18  SpO2 100%   Physical Exam  Constitutional: He is oriented to person, place, and time. He appears well-developed and well-nourished. No distress.  HENT:  Head: Normocephalic and atraumatic.  Mouth/Throat: Oropharynx is clear and moist.  Eyes: Conjunctivae and EOM are normal. Pupils are equal, round, and reactive to light.  Neck: Normal range of motion. Neck supple.  Cardiovascular: Normal rate, regular rhythm and normal heart sounds.   Pulmonary/Chest: Effort normal and breath sounds normal. No respiratory  distress. He has no wheezes.  Abdominal: Soft. Bowel sounds are normal. There is no tenderness. There is no guarding.  Genitourinary:  Non-tender external hemorrhoid noted without bleeding or signs of thrombosis; left side of rectum with small amount of purulent drainage noted and TTP extending into left buttock  Musculoskeletal: Normal range of motion.  Neurological: He is alert and oriented to person, place, and time.  Skin: Skin is warm and dry. He is not diaphoretic.  Psychiatric: He has a normal mood and affect.  Nursing note and vitals reviewed.   ED Course  Procedures (including critical care time) Labs Review Labs Reviewed  CBC WITH DIFFERENTIAL/PLATELET - Abnormal; Notable for the following:    WBC 14.3 (*)    MCV 72.3 (*)    MCH 24.0 (*)    Neutro Abs 10.7 (*)    All other components within normal limits  BASIC METABOLIC PANEL    Imaging Review Ct Pelvis W Contrast  04/13/2015   CLINICAL DATA:  Patient complains of severe perineal pain, and fever.  EXAM: CT PELVIS WITH CONTRAST  TECHNIQUE: Multidetector CT imaging of the pelvis was performed using the standard protocol following the bolus administration of intravenous contrast.  CONTRAST:  OMNIPAQUE IOHEXOL 300 MG/ML  SOLN  COMPARISON:  None.  FINDINGS: There is a 2.8 x 2.3 cm rim enhancing fluid collection within the left perianal soft tissue. There is no evidence of bowel wall thickening, or obstruction within the visualized small and large bowel loops.  The visualized portion of the appendix is normal.  Normal male genitalia.  Prostate calcifications are noted.  Vascular/Lymphatic: Unremarkable.No intra-abdominal lymphadenopathy. Prominent right more than left inguinal lymph nodes are seen.  No acute osseus abnormalities.  IMPRESSION: 2.8 cm rim enhancing left perianal fluid collection, likely representing perianal abscess.  Calcifications within the prostate gland, nonspecific finding which may be seen in post infectious  setting.  No evidence of abnormalities within visualized portion of the pelvis otherwise.   Electronically Signed   By: Ted Mcalpine M.D.   On: 04/13/2015 19:29   I have personally reviewed and evaluated these images and lab results as part of my medical decision-making.   EKG Interpretation None      MDM   Final diagnoses:  Peri-rectal abscess   34 year old male here for rectal pain. Seen at urgent care and sent here for further violation a possible abscess. Patient is afebrile, nontoxic.  He does have noted tenderness of his rectum. He has small external hemorrhoids which are nontender, small amount of purulent drainage noted to left side of rectum. Will obtain lab work and CT pelvis for further evaluation.  Labwork with leukocytosis of 14,000. CT does confirm 2.8 cm peri-rectal abscess.  General surgery consulted, Dr. Sheliah Hatch, who has drained abscess at bedside.  Patient was given rocephin here in the ED, will d/c home with bactrim per surgery recommendations.  FU with CCS in clinic in 10 days.  Discussed plan with patient, he/she acknowledged understanding and agreed with plan of care.  Return precautions given for new or worsening symptoms.  Garlon Hatchet, PA-C 04/13/15 2231  Gilda Crease, MD 04/14/15 [phone]

## 2015-04-13 NOTE — Procedures (Signed)
Incision and Drainage Procedure Note  Pre-operative Diagnosis: perirectal abscess  Post-operative Diagnosis: same  Indications: leukocytosis, abscess, fever  Anesthesia: 1% lidocaine with epinephrine  Procedure Details  The procedure, risks and complications have been discussed in detail (including, but not limited to airway compromise, infection, bleeding) with the patient, and the patient has signed consent to the procedure.  The skin was sterilely prepped and draped over the affected area in the usual fashion. After adequate local anesthesia, I&D with a #11 blade was performed on the left anterior perianal area. Purulent drainage: present The patient was observed until stable.  Findings: Perirectal abscess  EBL: 10 cc's  Drains: 1/2 inch iodoform  Condition: Tolerated procedure well   Complications: none.

## 2015-04-13 NOTE — ED Notes (Signed)
Pt has given verbal consent for I&D procedure.

## 2015-04-13 NOTE — Consult Note (Signed)
Reason for Consult:abscess Referring Physician: ER  Richard Meza is an 34 y.o. male.  HPI: 34 yo male with 7 days pain in anus, at first thought area had a zit. Had subjective high fevers. +BM today, with moderate discomfort. No previous abscesses, no history of diabetes or familiy history of diabetes, no history of HIV or hepatitis  History reviewed. No pertinent past medical history.  Past Surgical History  Procedure Laterality Date  . Lung surgery      No family history on file.  Social History:  reports that he has never smoked. He does not have any smokeless tobacco history on file. He reports that he does not drink alcohol or use illicit drugs.  Allergies: No Known Allergies  Medications: I have reviewed the patient's current medications.  Results for orders placed or performed during the hospital encounter of 04/13/15 (from the past 48 hour(s))  CBC with Differential     Status: Abnormal   Collection Time: 04/13/15  4:36 PM  Result Value Ref Range   WBC 14.3 (H) 4.0 - 10.5 K/uL   RBC 5.45 4.22 - 5.81 MIL/uL   Hemoglobin 13.1 13.0 - 17.0 g/dL   HCT 39.4 39.0 - 52.0 %   MCV 72.3 (L) 78.0 - 100.0 fL   MCH 24.0 (L) 26.0 - 34.0 pg   MCHC 33.2 30.0 - 36.0 g/dL   RDW 13.1 11.5 - 15.5 %   Platelets 312 150 - 400 K/uL   Neutrophils Relative % 75 %   Neutro Abs 10.7 (H) 1.7 - 7.7 K/uL   Lymphocytes Relative 14 %   Lymphs Abs 2.0 0.7 - 4.0 K/uL   Monocytes Relative 6 %   Monocytes Absolute 0.8 0.1 - 1.0 K/uL   Eosinophils Relative 4 %   Eosinophils Absolute 0.6 0.0 - 0.7 K/uL   Basophils Relative 1 %   Basophils Absolute 0.1 0.0 - 0.1 K/uL  Basic metabolic panel     Status: None   Collection Time: 04/13/15  4:36 PM  Result Value Ref Range   Sodium 139 135 - 145 mmol/L   Potassium 3.5 3.5 - 5.1 mmol/L   Chloride 106 101 - 111 mmol/L   CO2 27 22 - 32 mmol/L   Glucose, Bld 71 65 - 99 mg/dL   BUN 12 6 - 20 mg/dL   Creatinine, Ser 0.96 0.61 - 1.24 mg/dL   Calcium 9.0 8.9  - 10.3 mg/dL   GFR calc non Af Amer >60 >60 mL/min   GFR calc Af Amer >60 >60 mL/min    Comment: (NOTE) The eGFR has been calculated using the CKD EPI equation. This calculation has not been validated in all clinical situations. eGFR's persistently <60 mL/min signify possible Chronic Kidney Disease.    Anion gap 6 5 - 15    Ct Pelvis W Contrast  04/13/2015   CLINICAL DATA:  Patient complains of severe perineal pain, and fever.  EXAM: CT PELVIS WITH CONTRAST  TECHNIQUE: Multidetector CT imaging of the pelvis was performed using the standard protocol following the bolus administration of intravenous contrast.  CONTRAST:  198m OMNIPAQUE IOHEXOL 300 MG/ML  SOLN  COMPARISON:  None.  FINDINGS: There is a 2.8 x 2.3 cm rim enhancing fluid collection within the left perianal soft tissue. There is no evidence of bowel wall thickening, or obstruction within the visualized small and large bowel loops. The visualized portion of the appendix is normal.  Normal male genitalia.  Prostate calcifications are noted.  Vascular/Lymphatic:  Unremarkable.No intra-abdominal lymphadenopathy. Prominent right more than left inguinal lymph nodes are seen.  No acute osseus abnormalities.  IMPRESSION: 2.8 cm rim enhancing left perianal fluid collection, likely representing perianal abscess.  Calcifications within the prostate gland, nonspecific finding which may be seen in post infectious setting.  No evidence of abnormalities within visualized portion of the pelvis otherwise.   Electronically Signed   By: Fidela Salisbury M.D.   On: 04/13/2015 19:29    Review of Systems  Constitutional: Positive for fever. Negative for chills.  HENT: Negative for hearing loss.   Eyes: Negative for blurred vision and double vision.  Respiratory: Negative for cough and hemoptysis.   Cardiovascular: Negative for chest pain and palpitations.  Gastrointestinal: Negative for heartburn, nausea and abdominal pain.  Genitourinary: Negative for  dysuria and urgency.  Musculoskeletal: Negative for myalgias and neck pain.  Skin: Negative for itching and rash.  Neurological: Negative for dizziness, tingling, tremors and headaches.  Endo/Heme/Allergies: Negative for environmental allergies. Does not bruise/bleed easily.  Psychiatric/Behavioral: Negative for depression and suicidal ideas.   Blood pressure 138/72, pulse 80, temperature 98.9 F (37.2 C), temperature source Oral, resp. rate 16, SpO2 100 %. Physical Exam  Constitutional: He is oriented to person, place, and time. He appears well-developed and well-nourished.  HENT:  Head: Normocephalic and atraumatic.  Eyes: Conjunctivae are normal.  Neck: Normal range of motion. Neck supple.  Cardiovascular: Normal rate and regular rhythm.   Respiratory: Effort normal and breath sounds normal.  GI: Soft. He exhibits no distension. There is no tenderness.  Musculoskeletal: Normal range of motion.  Neurological: He is alert and oriented to person, place, and time.  perianal: left anterior area with induration and swelling, tender to palpation.  Assessment/Plan: perirectal abscess -discussed with the patient the pathology of the disease, the general treatment of abx and drainage, the risk of transformation to fistula, the risk for worsening disease. The patient agreed with the plan for incision and drainage. -will perform bedside I+D -ceftriaxone now, then bactrim 7 days -pain control  Arta Bruce Kinsinger 04/13/2015, 8:28 PM

## 2015-04-13 NOTE — ED Notes (Signed)
Pt was sent to ER from [facility] , paperwork sts : "severe perineal pain, fever, r/o perirectal abscess, likely needs pelvic CT".

## 2015-04-17 ENCOUNTER — Encounter: Payer: Self-pay | Admitting: *Deleted

## 2015-04-21 NOTE — Progress Notes (Signed)
Patient ID: Richard Meza, male   DOB: 1981/02/06, 34 y.o.   MRN: [id number]     Cardiology Office Note   Date:  04/22/2015   ID:  Richard Meza, Richard Meza 08-Apr-1981, MRN [id number]  PCP:  No primary care provider on file.  Cardiologist:   Allred  No chief complaint on file.     History of Present Illness: 34 y.o. seen by Dr Johney Frame 08/2014 for electrophysiology evaluation.  Seen in ER with dyspnea and had mid RP tachycardia.  Rx with beta blockers and resolved. 11/02/14 ETT went 11:45 with no ischemia and HTN response to exercise   F/u echo reviewed 10/2014  EF 50-55% moderate AR  Seen in ER by DR Kinsinger 04/13/15  for peri rectal abscess Rx I/D and ceftriaxone with 7 day course of Bactrim.  Had f/u visit today and improved He has no history of murmur , rheumatic fever.  No exertional dyspnea syncope or chest pain.      Past Medical History  Diagnosis Date  . History of palpitations   . History of shortness of breath   . History of chest pain     Past Surgical History  Procedure Laterality Date  . Lung surgery       Current Outpatient Prescriptions  Medication Sig Dispense Refill  . docusate sodium (COLACE) 100 MG capsule Take 1 capsule (100 mg total) by mouth 2 (two) times daily. 40 capsule 0  . HYDROcodone-acetaminophen (NORCO/VICODIN) 5-325 MG per tablet Take 1 tablet by mouth every 6 (six) hours as needed for moderate pain. 30 tablet 0   No current facility-administered medications for this visit.    Allergies:   Review of patient's allergies indicates no known allergies.    Social History:  The patient  reports that he has never smoked. He does not have any smokeless tobacco history on file. He reports that he does not drink alcohol or use illicit drugs.   Family History:  The patient's family history includes Alcoholism in his father; Other in his mother.    ROS:  Please see the history of present illness.   Otherwise, review of systems are positive for none.   All other  systems are reviewed and negative.    PHYSICAL EXAM: VS:  BP 118/60 mmHg  Pulse 56  Ht  (1.651 m)  Wt 82.01 kg (180 lb 12.8 oz)  BMI 30.09 kg/m2 , BMI Body mass index is 30.09 kg/(m^2). Affect appropriate Healthy:  appears stated age HEENT: normal Neck supple with no adenopathy JVP normal no bruits no thyromegaly Lungs clear with no wheezing and good diaphragmatic motion Heart:  S1/S2 AR  murmur, no rub, gallop or click PMI normal Abdomen: benighn, BS positve, no tenderness, no AAA no bruit.  No HSM or HJR Distal pulses intact with no bruits No edema Neuro non-focal Skin warm and dry No muscular weakness    EKG:  11/02/14  SR rate 72 normal QRT/T waves PR 202 msec   Recent Labs: 04/13/2015: BUN 12; Creatinine, Ser 0.96; Hemoglobin 13.1; Platelets 312; Potassium 3.5; Sodium 139    Lipid Panel    Component Value Date/Time   CHOL 191 11/02/2014 1003   TRIG 239.0* 11/02/2014 1003   HDL 37.30* 11/02/2014 1003   CHOLHDL 5 11/02/2014 1003   VLDL 47.8* 11/02/2014 1003   LDLDIRECT 70.0 11/02/2014 1003      Wt Readings from Last 3 Encounters:  04/22/15 82.01 kg (180 lb 12.8 oz)  04/13/15 83.462 kg (  184 lb)  09/19/14 85.548 kg (188 lb 9.6 oz)      Other studies Reviewed: Additional studies/ records that were reviewed today include: Epic notes EP evaluation Dr Johney Frame 08/2014 Multiple ECG;s ETT and Echo     ASSESSMENT AND PLAN:  1.  AR: moderate normal pulse pressure asymptomatic with normal LV size and function f/u echo in a year 2.: SVT: not recurrent mid RP tachycardia f/u Allred for recurrence 3. Elevated triglycerides  F/u primary consider starting tricor 4. Peri Rectal abscess: finished antibiotics f/u general surgery  5. HTN: borderline had exercise HTN baseline ok now Diet Rx  Consider adding ACE in future in light of AR    Current medicines are reviewed at length with the patient today.  The patient does not have concerns regarding medicines.  The  following changes have been made:  no change  Labs/ tests ordered today include: None  No orders of the defined types were placed in this encounter.     Disposition:   FU with me in a year with echo for AR     Signed, Charlton Haws, MD  04/22/2015 11:39 AM    [facility] [address] [place], [place], [place]  [address] Phone: [phone]; Fax: [phone]

## 2015-04-22 ENCOUNTER — Ambulatory Visit (INDEPENDENT_AMBULATORY_CARE_PROVIDER_SITE_OTHER): Payer: BLUE CROSS/BLUE SHIELD | Admitting: Cardiovascular Disease

## 2015-04-22 ENCOUNTER — Encounter: Payer: Self-pay | Admitting: Cardiovascular Disease

## 2015-04-22 VITALS — BP 118/60 | HR 56 | Ht 65.0 in | Wt 180.8 lb

## 2015-04-22 DIAGNOSIS — I471 Supraventricular tachycardia: Secondary | ICD-10-CM | POA: Diagnosis not present

## 2015-04-22 DIAGNOSIS — R0602 Shortness of breath: Secondary | ICD-10-CM | POA: Diagnosis not present

## 2015-04-22 DIAGNOSIS — E785 Hyperlipidemia, unspecified: Secondary | ICD-10-CM | POA: Diagnosis not present

## 2015-04-22 NOTE — Patient Instructions (Signed)
Medication Instructions:  Your physician recommends that you continue on your current medications as directed. Please refer to the Current Medication list given to you today.  Labwork: NONE Testing/Procedures: Your physician has requested that you have an echocardiogram. Echocardiography is a painless test that uses sound waves to create images of your heart. It provides your doctor with information about the size and shape of your heart and how well your heart's chambers and valves are working. This procedure takes approximately one hour. There are no restrictions for this procedure.    Follow-Up: Your physician wants you to follow-up in: YEAR WITH  DR Haywood Filler will receive a reminder letter in the mail two months in advance. If you don't receive a letter, please call our office to schedule the follow-up appointment. Any Other Special Instructions Will Be Listed Below (If Applicable).

## 2016-02-10 ENCOUNTER — Encounter: Payer: Self-pay | Admitting: Cardiovascular Disease

## 2016-04-10 ENCOUNTER — Encounter: Payer: Self-pay | Admitting: Cardiovascular Disease

## 2016-04-24 ENCOUNTER — Other Ambulatory Visit: Payer: Self-pay

## 2016-04-24 ENCOUNTER — Ambulatory Visit (INDEPENDENT_AMBULATORY_CARE_PROVIDER_SITE_OTHER): Payer: BLUE CROSS/BLUE SHIELD | Admitting: Cardiovascular Disease

## 2016-04-24 ENCOUNTER — Ambulatory Visit (HOSPITAL_COMMUNITY): Payer: BLUE CROSS/BLUE SHIELD | Attending: Cardiology

## 2016-04-24 VITALS — BP 112/54 | Ht 65.0 in | Wt 187.0 lb

## 2016-04-24 DIAGNOSIS — R0602 Shortness of breath: Secondary | ICD-10-CM | POA: Diagnosis not present

## 2016-04-24 DIAGNOSIS — I351 Nonrheumatic aortic (valve) insufficiency: Secondary | ICD-10-CM | POA: Insufficient documentation

## 2016-04-24 DIAGNOSIS — I471 Supraventricular tachycardia: Secondary | ICD-10-CM

## 2016-04-24 DIAGNOSIS — R011 Cardiac murmur, unspecified: Secondary | ICD-10-CM | POA: Insufficient documentation

## 2016-04-24 DIAGNOSIS — I517 Cardiomegaly: Secondary | ICD-10-CM | POA: Insufficient documentation

## 2016-04-24 NOTE — Patient Instructions (Addendum)
Medication Instructions:  Your physician recommends that you continue on your current medications as directed. Please refer to the Current Medication list given to you today.  Labwork: NONE  Testing/Procedures: Your physician has requested that you have an echocardiogram in 12 months. Echocardiography is a painless test that uses sound waves to create images of your heart. It provides your doctor with information about the size and shape of your heart and how well your heart's chambers and valves are working. This procedure takes approximately one hour. There are no restrictions for this procedure.  Follow-Up: Your physician wants you to follow-up in: 12 months with Dr. Eden EmmsNishan. You will receive a reminder letter in the mail two months in advance. If you don't receive a letter, please call our office to schedule the follow-up appointment.   If you need a refill on your cardiac medications before your next appointment, please call your pharmacy.

## 2016-04-24 NOTE — Progress Notes (Signed)
Patient ID: Richard Meza, male   DOB: 24-Jul-1981, 35 y.o.   MRN: [id number]     Cardiology Office Note   Date:  04/24/2016   ID:  Richard Meza, Richard Meza [birth year], MRN [id number]  PCP:  No primary care provider on file.  Cardiologist:   Richard Meza  Chief Complaint  Patient presents with  . Paroxysmal supraventricular tachycardia (HCC)      History of Present Illness: 35 y.o. seen by Dr Richard Meza 08/2014 for electrophysiology evaluation.  Seen in ER with dyspnea and had mid RP tachycardia.  Rx with beta blockers and resolved. 11/02/14 ETT went 11:45 with no ischemia and HTN response to exercise   F/u echo reviewed 10/2014  EF 50-55% moderate AR  Seen in ER by DR Richard Meza 04/13/15  for peri rectal abscess Rx I/D and ceftriaxone with 7 day course of Bactrim.  Had f/u visit today and improved He has no history of murmur , rheumatic fever.  No exertional dyspnea syncope or chest pain.    Echo from today reviewed and moderate AR stable compared to 2016   Past Medical History:  Diagnosis Date  . History of chest pain   . History of palpitations   . History of shortness of breath     Past Surgical History:  Procedure Laterality Date  . LUNG SURGERY       No current outpatient prescriptions on file.   No current facility-administered medications for this visit.     Allergies:   Review of patient's allergies indicates no known allergies.    Social History:  The patient  reports that he has never smoked. He has never used smokeless tobacco. He reports that he does not drink alcohol or use drugs.   Family History:  The patient's family history includes Alcoholism in his father; Other in his mother.    ROS:  Please see the history of present illness.   Otherwise, review of systems are positive for none.   All other systems are reviewed and negative.    PHYSICAL EXAM: VS:  BP (!) 112/54 (BP Location: Left Arm, Patient Position: Sitting, Cuff Size: Normal)   Ht 5\' 5"  (1.651 m)   Wt 84.8 kg  (187 lb)   BMI 31.12 kg/m  , BMI Body mass index is 31.12 kg/m. Affect appropriate Healthy:  appears stated age HEENT: normal Neck supple with no adenopathy JVP normal no bruits no thyromegaly Lungs clear with no wheezing and good diaphragmatic motion Heart:  S1/S2 AR  murmur, no rub, gallop or click PMI normal Abdomen: benighn, BS positve, no tenderness, no AAA no bruit.  No HSM or HJR Distal pulses intact with no bruits No edema Neuro non-focal Skin warm and dry No muscular weakness    EKG:  11/02/14  SR rate 72 normal QRT/T waves PR 202 msec 04/24/16 SR rate 58 normal    Recent Labs: No results found for requested labs within last 8760 hours.    Lipid Panel    Component Value Date/Time   CHOL 191 11/02/2014 1003   TRIG 239.0 (H) 11/02/2014 1003   HDL 37.30 (L) 11/02/2014 1003   CHOLHDL 5 11/02/2014 1003   VLDL 47.8 (H) 11/02/2014 1003   LDLDIRECT 70.0 11/02/2014 1003      Wt Readings from Last 3 Encounters:  04/24/16 84.8 kg (187 lb)  04/22/15 82 kg (180 lb 12.8 oz)  04/13/15 83.5 kg (184 lb)      Other studies Reviewed: Additional studies/ records that were  reviewed today include: Epic notes EP evaluation Dr Richard FrameAllred 08/2014 Multiple ECG;s ETT and Echo     ASSESSMENT AND PLAN:  1.  AR: moderate normal pulse pressure asymptomatic with normal LV size and function f/u echo in a year 2.: SVT: not recurrent mid RP tachycardia f/u Richard Meza for recurrence 3. Elevated triglycerides  F/u primary consider starting tricor 4. Peri Rectal abscess: finished antibiotics f/u general surgery  5. HTN: borderline had exercise HTN baseline ok now Diet Rx  Consider adding ACE in future in light of AR    Current medicines are reviewed at length with the patient today.  The patient does not have concerns regarding medicines.  The following changes have been made:  no change  Labs/ tests ordered today include: None   Orders Placed This Encounter  Procedures  . EKG 12-Lead    . ECHOCARDIOGRAM COMPLETE     Disposition:   FU with me in a year with echo for AR     Signed, Richard HawsPeter Jazier Mcglamery, MD  04/24/2016 8:35 AM    [facility] [address], [place], [place]  [address] Phone: [phone]; Fax: [phone]

## 2016-06-14 IMAGING — CT CT ANGIO CHEST
2 of 8 series · 19 of 46 positions shown · IV contrast (OMNI)
Comparison: None.

CLINICAL DATA: Chest pain for 4 days with shortness of breath and
diaphoresis.

EXAM:
CT ANGIOGRAPHY CHEST WITH CONTRAST
TECHNIQUE: Multidetector CT imaging of the chest was performed using the
standard protocol during bolus administration of intravenous
contrast. Multiplanar CT image reconstructions and MIPs were
obtained to evaluate the vascular anatomy.
CONTRAST:  64 mL OMNIPAQUE IOHEXOL 350 MG/ML SOLN

[Series 5: thins · axial · 0.64mm/px · z∈[+970,+1202]mm · 16 of 256 slices shown]
[im 12/256  lung]
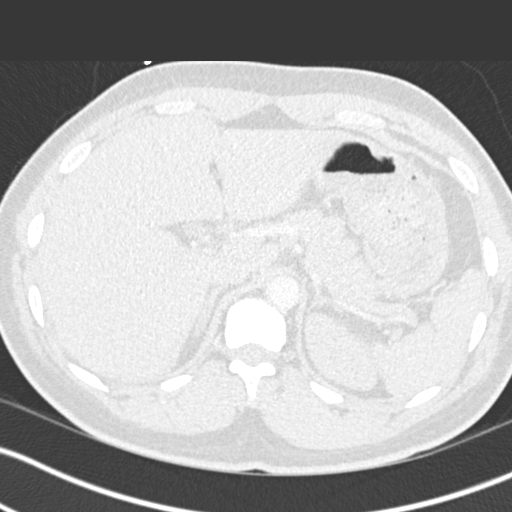
[im 24/256  soft-tissue]
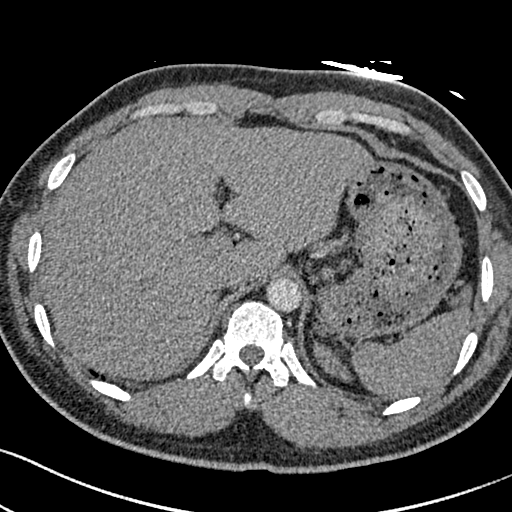
[im 47/256  lung]
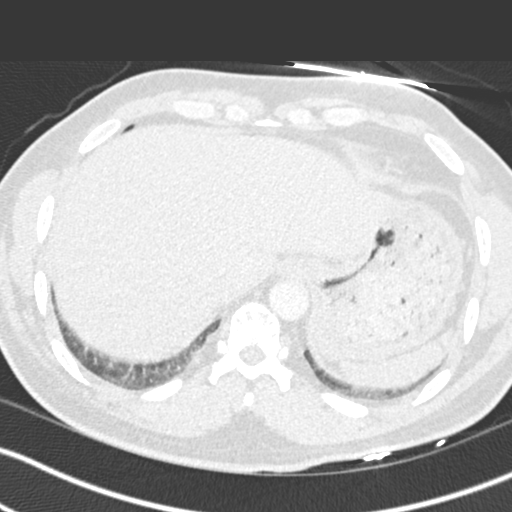
[im 58/256  soft-tissue]
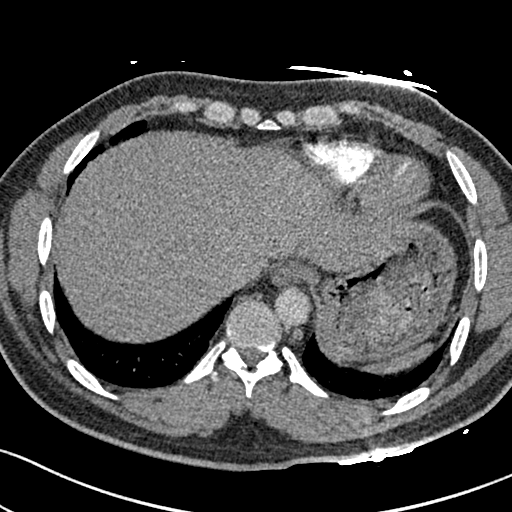
[im 70/256  lung]
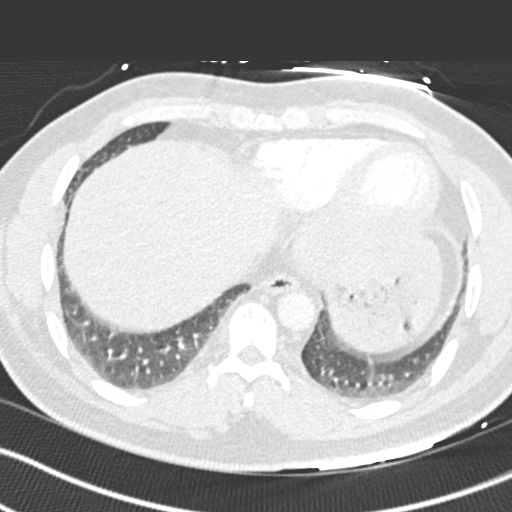
[im 93/256  soft-tissue]
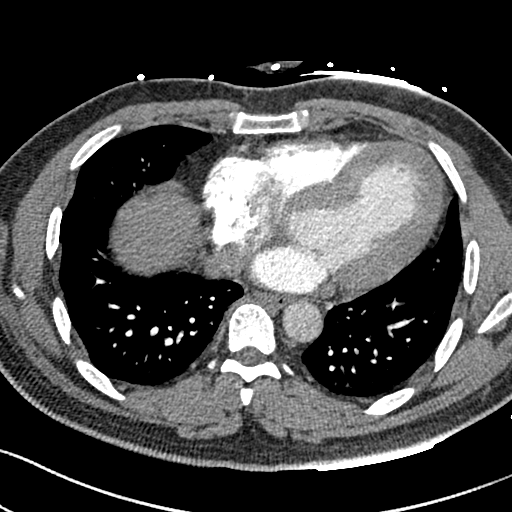
[im 105/256  lung]
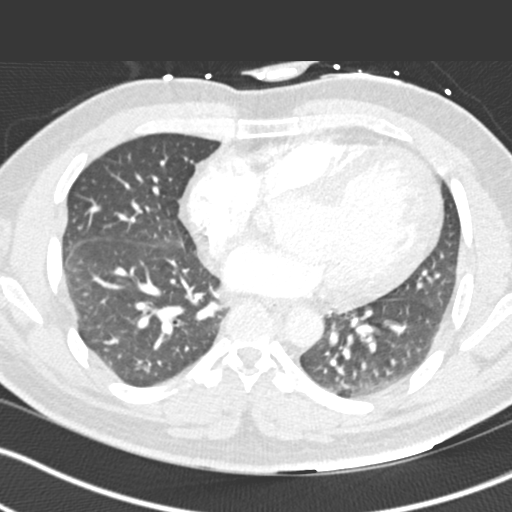
[im 116/256  soft-tissue]
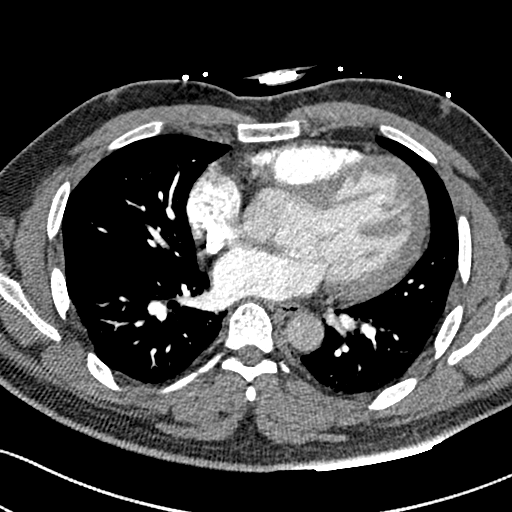
[im 140/256  lung]
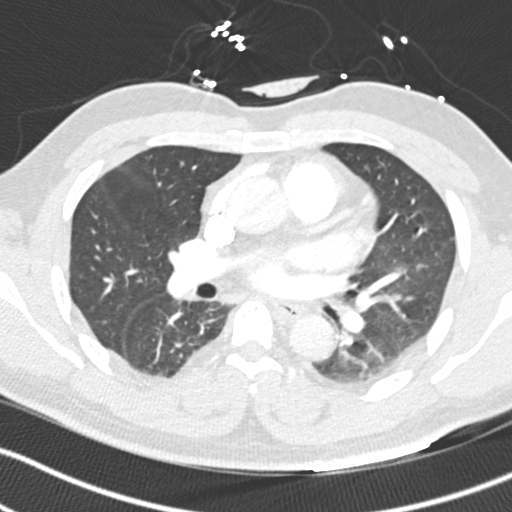
[im 151/256  soft-tissue]
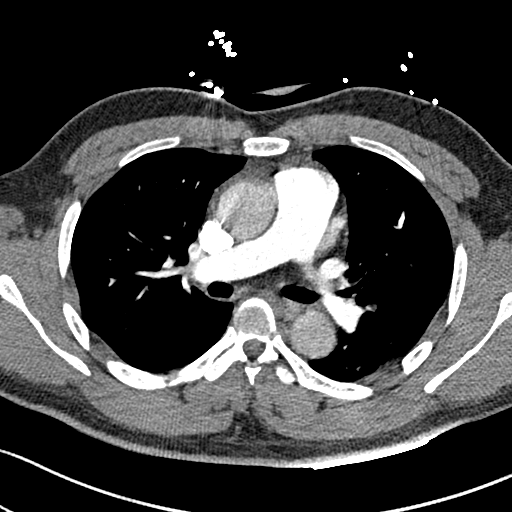
[im 163/256  lung]
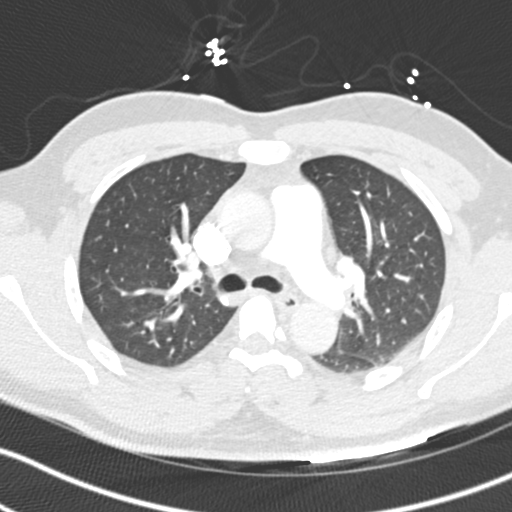
[im 186/256  soft-tissue]
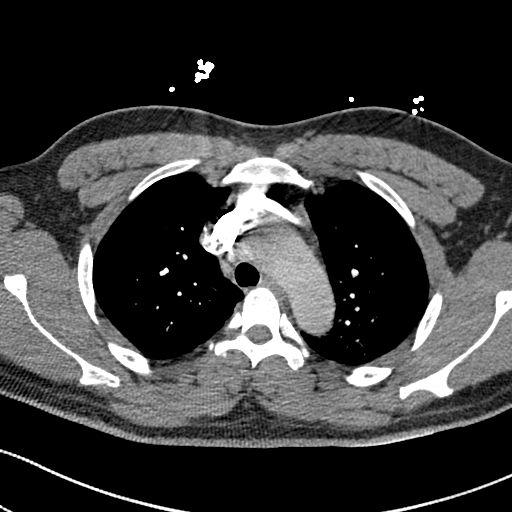
[im 198/256  lung]
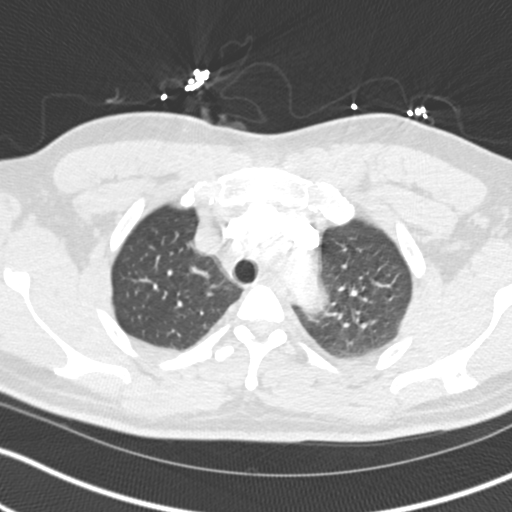
[im 209/256  soft-tissue]
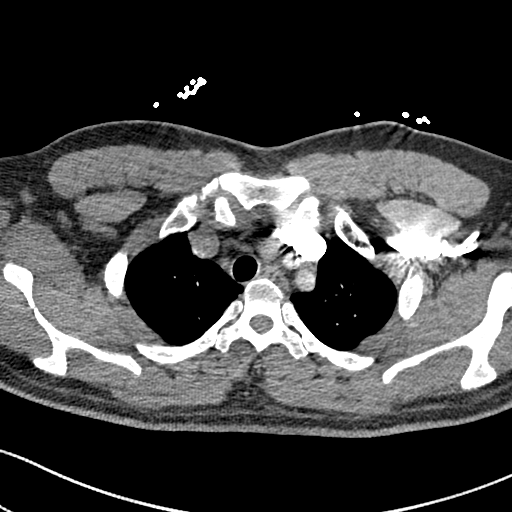
[im 232/256  lung]
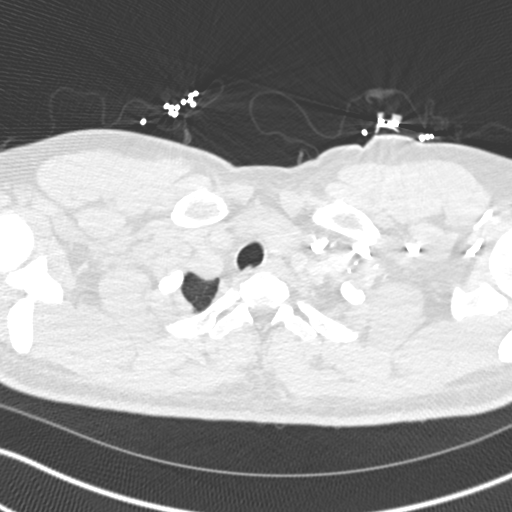
[im 244/256  soft-tissue]
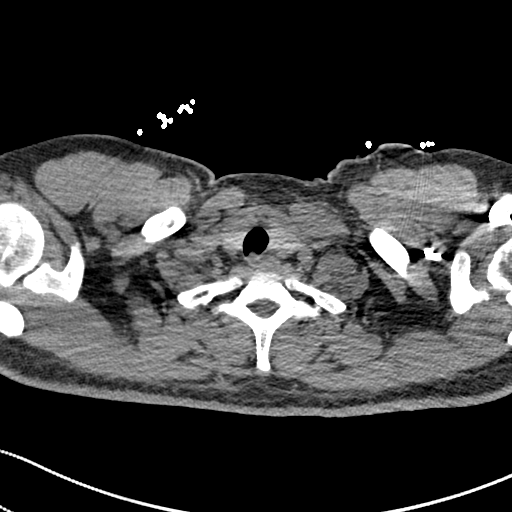

[Series 7: coronal mpr · coronal · 0.59mm/px · 3 of 105 slices shown]
[im 27/105  soft-tissue]
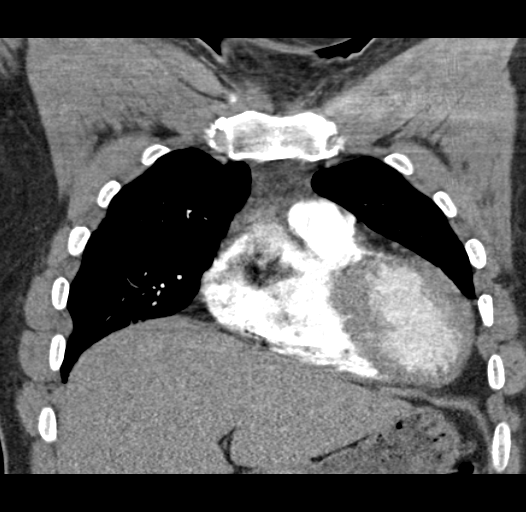
[im 53/105  soft-tissue]
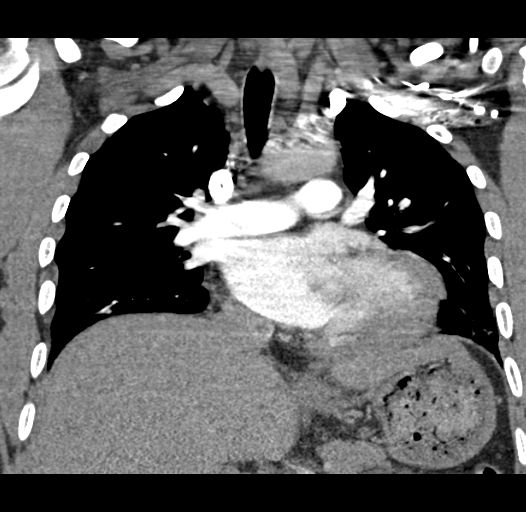
[im 79/105  soft-tissue]
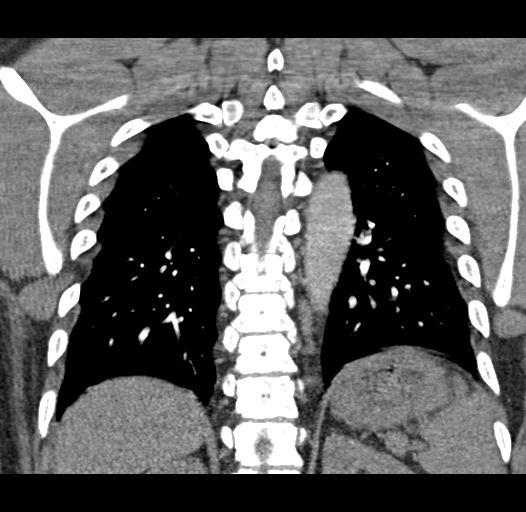

[19 of 46 positions shown; findings below may reference images not displayed]

FINDINGS: No pulmonary embolus is identified. No pleural or pericardial
effusion. There is no axillary, hilar or mediastinal
lymphadenopathy. Heart size appears enlarged. The lungs are clear.
Visualized upper abdomen is unremarkable. No focal bony abnormality
is identified.

Review of the MIP images confirms the above findings.
IMPRESSION: Negative for pulmonary embolus or acute disease.

Mild cardiomegaly.

## 2017-04-30 ENCOUNTER — Other Ambulatory Visit (HOSPITAL_COMMUNITY): Payer: BLUE CROSS/BLUE SHIELD

## 2017-05-11 ENCOUNTER — Other Ambulatory Visit (HOSPITAL_COMMUNITY): Payer: BLUE CROSS/BLUE SHIELD

## 2017-05-21 ENCOUNTER — Ambulatory Visit (HOSPITAL_COMMUNITY): Payer: BLUE CROSS/BLUE SHIELD | Attending: Cardiovascular Disease

## 2017-05-21 ENCOUNTER — Other Ambulatory Visit: Payer: Self-pay

## 2017-05-21 DIAGNOSIS — I080 Rheumatic disorders of both mitral and aortic valves: Secondary | ICD-10-CM | POA: Diagnosis not present

## 2017-05-21 DIAGNOSIS — I480 Paroxysmal atrial fibrillation: Secondary | ICD-10-CM | POA: Diagnosis not present

## 2017-05-21 DIAGNOSIS — R0600 Dyspnea, unspecified: Secondary | ICD-10-CM | POA: Insufficient documentation

## 2017-05-21 DIAGNOSIS — I351 Nonrheumatic aortic (valve) insufficiency: Secondary | ICD-10-CM

## 2017-07-20 ENCOUNTER — Emergency Department (HOSPITAL_COMMUNITY)
Admission: EM | Admit: 2017-07-20 | Discharge: 2017-07-20 | Disposition: A | Discharge status: Home / Self Care | Payer: BLUE CROSS/BLUE SHIELD | Attending: Emergency Medicine | Admitting: Emergency Medicine

## 2017-07-20 ENCOUNTER — Emergency Department (HOSPITAL_COMMUNITY): Payer: BLUE CROSS/BLUE SHIELD

## 2017-07-20 ENCOUNTER — Encounter (HOSPITAL_COMMUNITY): Payer: Self-pay | Admitting: Emergency Medicine

## 2017-07-20 DIAGNOSIS — K610 Anal abscess: Secondary | ICD-10-CM | POA: Diagnosis not present

## 2017-07-20 DIAGNOSIS — R011 Cardiac murmur, unspecified: Secondary | ICD-10-CM | POA: Insufficient documentation

## 2017-07-20 DIAGNOSIS — E785 Hyperlipidemia, unspecified: Secondary | ICD-10-CM | POA: Diagnosis not present

## 2017-07-20 DIAGNOSIS — I471 Supraventricular tachycardia: Secondary | ICD-10-CM | POA: Diagnosis not present

## 2017-07-20 LAB — CBC
HCT: 40.4 % (ref 39.0–52.0)
HEMOGLOBIN: 13.3 g/dL (ref 13.0–17.0)
MCH: 24.1 pg — ABNORMAL LOW (ref 26.0–34.0)
MCHC: 32.9 g/dL (ref 30.0–36.0)
MCV: 73.1 fL — ABNORMAL LOW (ref 78.0–100.0)
PLATELETS: 247 10*3/uL (ref 150–400)
RBC: 5.53 MIL/uL (ref 4.22–5.81)
RDW: 13.6 % (ref 11.5–15.5)
WBC: 9.9 10*3/uL (ref 4.0–10.5)

## 2017-07-20 LAB — COMPREHENSIVE METABOLIC PANEL
ALBUMIN: 3.8 g/dL (ref 3.5–5.0)
ALK PHOS: 66 U/L (ref 38–126)
ALT: 18 U/L (ref 17–63)
ANION GAP: 4 — AB (ref 5–15)
AST: 18 U/L (ref 15–41)
BUN: 15 mg/dL (ref 6–20)
CALCIUM: 9 mg/dL (ref 8.9–10.3)
CHLORIDE: 107 mmol/L (ref 101–111)
CO2: 28 mmol/L (ref 22–32)
Creatinine, Ser: 0.97 mg/dL (ref 0.61–1.24)
GFR calc Af Amer: >60 mL/min (ref >60)
GFR calc non Af Amer: >60 mL/min (ref >60)
GLUCOSE: 92 mg/dL (ref 65–99)
POTASSIUM: 4 mmol/L (ref 3.5–5.1)
SODIUM: 139 mmol/L (ref 135–145)
Total Bilirubin: 0.5 mg/dL (ref 0.3–1.2)
Total Protein: 7.5 g/dL (ref 6.5–8.1)

## 2017-07-20 MED ORDER — IOPAMIDOL (ISOVUE-300) INJECTION 61%
INTRAVENOUS | Status: AC
Start: 2017-07-20 — End: 2017-07-20
  Administered 2017-07-20: 100 mL via INTRAVENOUS
  Filled 2017-07-20: qty 100

## 2017-07-20 MED ORDER — LIDOCAINE HCL (PF) 1 % IJ SOLN
10.0000 mL | Freq: Once | INTRAMUSCULAR | Status: AC
  Administered 2017-07-20: 10 mL
  Filled 2017-07-20: qty 10

## 2017-07-20 MED ORDER — OXYCODONE-ACETAMINOPHEN 5-325 MG PO TABS
1.0000 | ORAL_TABLET | Freq: Once | ORAL | Status: AC
  Administered 2017-07-20: 1 via ORAL
  Filled 2017-07-20: qty 1

## 2017-07-20 MED ORDER — DOXYCYCLINE HYCLATE 100 MG PO CAPS: 100.0000 mg | ORAL_CAPSULE | Freq: Two times a day (BID) | ORAL | 0 refills | Status: DC

## 2017-07-20 NOTE — Discharge Instructions (Signed)
Take antibiotics as prescribed.  Take the entire course of antibiotics, even if your symptoms improve. Use Tylenol or ibuprofen as needed for pain. Keep the area clean, wash twice daily with soap and water and after every bowel movement. Follow-up with the surgery center if symptoms return or if you would like further evaluation. Return to the emergency room if you develop persistent high fevers, vomiting, worsening pain or drainage, or any new or concerning symptoms.

## 2017-07-20 NOTE — ED Triage Notes (Signed)
Patient c/o pimple on right buttock that came up on Thursday but has gotten bigger and painful. Denies any drainage. Reports that his wife tried to cut it open but hard.

## 2017-07-20 NOTE — ED Provider Notes (Signed)
[facility]-EMERGENCY DEPT Provider Note   CSN: [id number] Arrival date & time: 07/20/17  1031     History   Chief Complaint Chief Complaint  Patient presents with  . Abscess    HPI Richard Meza is a 36 y.o. male presenting with rectal pain.  Patient states that for the past 5 days, he has had pain in his rectum.  He states he has a lesion between his butt cheeks which is growing bigger and more painful.  Pain is not improved with ibuprofen or aspirin.  Nothing makes it better, movement and palpation makes it worse.  He denies fevers, chills, chest pain, shortness breath, nausea, vomiting, abdominal pain.  He denies urinary symptoms or abnormal bowel movements, although states it hurts worse with bowel movements.  Denies blood in his stool.  He has history of similar which required I&D by general surgery 2 yrs ago.  He has no other medical problems, does not take medications daily.  He is not on blood thinners.  Last ate breakfast this morning around 0930.  Last tetanus shot within the past 10 years.  HPI  Past Medical History:  Diagnosis Date  . History of chest pain   . History of palpitations   . History of shortness of breath     Patient Active Problem List   Diagnosis Date Noted  . Paroxysmal supraventricular tachycardia (HCC) 09/20/2014  . Murmur 09/20/2014  . Hyperlipidemia 09/20/2014    Past Surgical History:  Procedure Laterality Date  . LUNG SURGERY         Home Medications    Prior to Admission medications   Medication Sig Start Date End Date Taking? Authorizing Provider  aspirin 325 MG tablet Take 650 mg by mouth every 6 (six) hours as needed for mild pain or moderate pain.   Yes [provider]  ibuprofen (ADVIL,MOTRIN) 200 MG tablet Take 400 mg by mouth every 6 (six) hours as needed for mild pain or moderate pain.   Yes [provider]  doxycycline (VIBRAMYCIN) 100 MG capsule Take 1 capsule (100 mg total) by mouth  2 (two) times daily. 07/20/17   Aryani Daffern, PA-C    Family History Family History  Problem Relation Age of Onset  . Other Mother        health problems but not sure what.  . Alcoholism Father     Social History Social History   Tobacco Use  . Smoking status: Never Smoker  . Smokeless tobacco: Never Used  Substance Use Topics  . Alcohol use: No  . Drug use: No     Allergies   Patient has no known allergies.   Review of Systems Review of Systems  Constitutional: Negative for chills and fever.  Respiratory: Negative for cough and shortness of breath.   Cardiovascular: Negative for chest pain.  Gastrointestinal: Positive for rectal pain. Negative for abdominal pain, blood in stool, constipation, diarrhea, nausea and vomiting.  Genitourinary: Negative for dysuria, frequency and hematuria.  Musculoskeletal: Negative for back pain.  Skin: Negative for rash and wound.  Allergic/Immunologic: Negative for immunocompromised state.  Neurological: Negative for headaches.  Hematological: Does not bruise/bleed easily.  Psychiatric/Behavioral: Negative for confusion.     Physical Exam Updated Vital Signs BP (!) 152/71 (BP Location: Left Arm)   Pulse 72   Temp 97.7 F (36.5 C) (Oral)   Resp 16   Ht 5\' 5"  (1.651 m)   Wt 83.9 kg (185 lb)   SpO2 100%  BMI 30.79 kg/m   Physical Exam  Constitutional: He is oriented to person, place, and time. He appears well-developed and well-nourished. No distress.  HENT:  Head: Normocephalic and atraumatic.  Eyes: EOM are normal.  Neck: Normal range of motion.  Cardiovascular: Normal rate, regular rhythm and intact distal pulses.  Pulmonary/Chest: Effort normal and breath sounds normal. No respiratory distress. He has no wheezes.  Abdominal: Soft. Bowel sounds are normal. He exhibits no distension and no mass. There is no tenderness. There is no rebound and no guarding.  Genitourinary: Rectal exam shows external hemorrhoid and  tenderness. Rectal exam shows no fissure.  Genitourinary Comments: Chaperone present.  Patient with tender lesion around the anal sphincter.  No drainage at this time.  External hemorrhoids noted, lesion does not appear to be involved.  No fissure noted.  Musculoskeletal: Normal range of motion.  Neurological: He is alert and oriented to person, place, and time.  Skin: Skin is warm and dry.  Psychiatric: He has a normal mood and affect.  Nursing note and vitals reviewed.    ED Treatments / Results  Labs (all labs ordered are listed, but only abnormal results are displayed) Labs Reviewed  CBC - Abnormal; Notable for the following components:      Result Value   MCV 73.1 (*)    MCH 24.1 (*)    All other components within normal limits  COMPREHENSIVE METABOLIC PANEL - Abnormal; Notable for the following components:   Anion gap 4 (*)    All other components within normal limits    EKG  EKG Interpretation None       Radiology Ct Pelvis W Contrast  Result Date: 07/20/2017 CLINICAL DATA:  Patient c/o pimple on right buttock that came up on Thursday but has gotten bigger and painful. Denies any drainage. Reports that his wife tried to cut it open but hard. EXAM: CT PELVIS WITH CONTRAST TECHNIQUE: Multidetector CT imaging of the pelvis was performed using the standard protocol following the bolus administration of intravenous contrast. CONTRAST:  100 mL ISOVUE-300 IOPAMIDOL (ISOVUE-300) INJECTION 61% COMPARISON:  CT abdomen pelvis, 04/13/2015 FINDINGS: Urinary Tract: Bladder is unremarkable. Visualized distal ureters are normal in caliber. No stones. Bowel:  No dilation.  No wall thickening or inflammation. Vascular/Lymphatic: Prominent external iliac and inguinal lymph nodes. Largest node is a 13 mm short axis right inguinal node. No vascular abnormality. Reproductive: There is a fluid collection along the right posterior aspect of the anus extending to the skin surface of the perineum.  This measures 2.2 x 1.0 x 2.1 cm. There is mild adjacent fat inflammation. Prostate gland is unremarkable. Other:  None. Musculoskeletal: No fracture or acute finding. There is a 4 cm lesion in the proximal left femur. Lesion has a sclerotic rim. The internal matrix shows subtle densities that may reflect calcifications. Lesion is stable from the prior CT. No other bone lesions. No areas of bone resorption to suggest osteomyelitis. IMPRESSION: 1. Small superficial perineal abscess, measuring 2.2 cm, lying posterior and slightly to the right of the anus. 2. No other acute abnormalities. 3. Benign proximal left femur bone lesion. Electronically Signed   By: Amie Portlandavid  Ormond M.D.   On: 07/20/2017 14:00    Procedures .Marland Kitchen.Incision and Drainage Date/Time: 07/20/2017 3:54 PM Performed by: Alveria Apleyaccavale, Shaneca Orne, PA-C Authorized by: Alveria Apleyaccavale, Alva Kuenzel, PA-C   Consent:    Consent obtained:  Verbal   Consent given by:  Patient   Risks discussed:  Incomplete drainage, infection, bleeding and pain Location:  Type:  Pilonidal cyst   Location:  Anogenital   Anogenital location:  Perianal Pre-procedure details:    Skin preparation:  Betadine Anesthesia (see MAR for exact dosages):    Anesthesia method:  Local infiltration   Local anesthetic:  Lidocaine 1% w/o epi Procedure type:    Complexity:  Simple Procedure details:    Incision types:  Single straight   Incision depth:  Dermal   Scalpel blade:  10   Wound management:  Probed and deloculated and irrigated with saline   Drainage:  Serosanguinous and purulent   Drainage amount:  Moderate   Wound treatment:  Wound left open   Packing materials:  None Post-procedure details:    Patient tolerance of procedure:  Tolerated well, no immediate complications   (including critical care time)  Medications Ordered in ED Medications  oxyCODONE-acetaminophen (PERCOCET/ROXICET) 5-325 MG per tablet 1 tablet (1 tablet Oral Given 07/20/17 1204)  iopamidol  (ISOVUE-300) 61 % injection (100 mLs Intravenous Contrast Given 07/20/17 1342)  lidocaine (PF) (XYLOCAINE) 1 % injection 10 mL (10 mLs Infiltration Given 07/20/17 1608)     Initial Impression / Assessment and Plan / ED Course  I have reviewed the triage vital signs and the nursing notes.  Pertinent labs & imaging results that were available during my care of the patient were reviewed by me and considered in my medical decision making (see chart for details).     Patient presented for evaluation of rectal pain.  Physical exam patient is afebrile not tachycardic.  No signs of systemic infection at this time.  Rectum with lesion around anal sphincter.  Will obtain basic labs and CT for further assessment.  Due to its location, I am hesitant to perform any I&D in the ER. Norco for pain.   Labs reassuring, no leukocytosis.  Pain improved with Norco.  CT shows superficial lesion to the right of the anus.  Case discussed with Dr. Verdie MosherLiu, who evaluated the patient.  I&D performed, purulent material was expressed.  Aftercare instructions given.  Patient started on doxycycline.  Patient to follow-up with [facility] as needed or PCP. At this time, patient appears safe for discharge.  Return precautions given.  Patient states he understands and agrees to plan.   Final Clinical Impressions(s) / ED Diagnoses   Final diagnoses:  Perianal abscess    ED Discharge Orders        Ordered    doxycycline (VIBRAMYCIN) 100 MG capsule  2 times daily     07/20/17 1556       Rockinghamaccavale, OppSophia, PA-C 07/20/17 1627    Lavera GuiseLiu, Dana Duo, MD 07/20/17 1743

## 2017-08-18 ENCOUNTER — Telehealth: Payer: Self-pay | Admitting: Cardiovascular Disease

## 2017-08-18 DIAGNOSIS — I351 Nonrheumatic aortic (valve) insufficiency: Secondary | ICD-10-CM

## 2017-08-18 NOTE — Telephone Encounter (Signed)
Called patient with echo results. Per Dr. Eden EmmsNishan, Moderate AR stable LV not enlarged low normal EF f/u echo in a year. Patient verbalized understanding.

## 2017-08-18 NOTE — Telephone Encounter (Signed)
New Message    Patient is calling in reference to letter that he received to call for his lab results. Please call.

## 2018-07-07 ENCOUNTER — Encounter: Payer: Self-pay | Admitting: Cardiovascular Disease

## 2018-07-08 ENCOUNTER — Other Ambulatory Visit (HOSPITAL_COMMUNITY): Payer: BLUE CROSS/BLUE SHIELD

## 2018-07-13 ENCOUNTER — Telehealth: Payer: Self-pay

## 2018-07-13 NOTE — Telephone Encounter (Signed)
New message    Just an FYI. We have made several attempts to contact this patient including sending a letter to schedule or reschedule their echocardiogram. We will be removing the patient from the echo WQ.   Thank you 

## 2018-09-25 ENCOUNTER — Encounter (HOSPITAL_COMMUNITY): Payer: Self-pay | Admitting: Emergency Medicine

## 2018-09-25 ENCOUNTER — Emergency Department (HOSPITAL_COMMUNITY)
Admission: EM | Admit: 2018-09-25 | Discharge: 2018-09-25 | Disposition: A | Discharge status: Home / Self Care | Payer: 59 | Attending: Emergency Medicine | Admitting: Emergency Medicine

## 2018-09-25 ENCOUNTER — Emergency Department (HOSPITAL_COMMUNITY): Payer: 59

## 2018-09-25 DIAGNOSIS — I471 Supraventricular tachycardia: Secondary | ICD-10-CM | POA: Diagnosis not present

## 2018-09-25 DIAGNOSIS — R0602 Shortness of breath: Secondary | ICD-10-CM | POA: Diagnosis present

## 2018-09-25 DIAGNOSIS — Z7982 Long term (current) use of aspirin: Secondary | ICD-10-CM | POA: Insufficient documentation

## 2018-09-25 DIAGNOSIS — Z79899 Other long term (current) drug therapy: Secondary | ICD-10-CM | POA: Insufficient documentation

## 2018-09-25 LAB — CBC WITH DIFFERENTIAL/PLATELET
Abs Immature Granulocytes: 0.04 10*3/uL (ref 0.00–0.07)
Basophils Absolute: 0.1 10*3/uL (ref 0.0–0.1)
Basophils Relative: 1 %
Eosinophils Absolute: 0.2 10*3/uL (ref 0.0–0.5)
Eosinophils Relative: 1 %
HEMATOCRIT: 48.9 % (ref 39.0–52.0)
Hemoglobin: 15 g/dL (ref 13.0–17.0)
Immature Granulocytes: 0 %
Lymphocytes Relative: 14 %
Lymphs Abs: 2 10*3/uL (ref 0.7–4.0)
MCH: 22.8 pg — ABNORMAL LOW (ref 26.0–34.0)
MCHC: 30.7 g/dL (ref 30.0–36.0)
MCV: 74.3 fL — ABNORMAL LOW (ref 80.0–100.0)
Monocytes Absolute: 1 10*3/uL (ref 0.1–1.0)
Monocytes Relative: 7 %
Neutro Abs: 11 10*3/uL — ABNORMAL HIGH (ref 1.7–7.7)
Neutrophils Relative %: 77 %
Platelets: 263 10*3/uL (ref 150–400)
RBC: 6.58 MIL/uL — ABNORMAL HIGH (ref 4.22–5.81)
RDW: 13.7 % (ref 11.5–15.5)
WBC: 14.3 10*3/uL — ABNORMAL HIGH (ref 4.0–10.5)
nRBC: 0 % (ref 0.0–0.2)

## 2018-09-25 LAB — I-STAT TROPONIN, ED: Troponin i, poc: 0.09 ng/mL (ref 0.00–0.08)

## 2018-09-25 LAB — BASIC METABOLIC PANEL
Anion gap: 8 (ref 5–15)
BUN: 13 mg/dL (ref 6–20)
CO2: 23 mmol/L (ref 22–32)
Calcium: 9.2 mg/dL (ref 8.9–10.3)
Chloride: 107 mmol/L (ref 98–111)
Creatinine, Ser: 1.03 mg/dL (ref 0.61–1.24)
GFR calc Af Amer: >60 mL/min (ref >60)
GFR calc non Af Amer: >60 mL/min (ref >60)
Glucose, Bld: 130 mg/dL — ABNORMAL HIGH (ref 70–99)
Potassium: 3.8 mmol/L (ref 3.5–5.1)
Sodium: 138 mmol/L (ref 135–145)

## 2018-09-25 LAB — TSH: TSH: 0.395 u[IU]/mL (ref 0.350–4.500)

## 2018-09-25 MED ORDER — METOPROLOL TARTRATE 5 MG/5ML IV SOLN
5.0000 mg | Freq: Once | INTRAVENOUS | Status: AC
  Administered 2018-09-25: 5 mg via INTRAVENOUS
  Filled 2018-09-25: qty 5

## 2018-09-25 MED ORDER — METOPROLOL TARTRATE 25 MG PO TABS: 12.5000 mg | ORAL_TABLET | Freq: Two times a day (BID) | ORAL | 0 refills | Status: DC

## 2018-09-25 MED ORDER — ADENOSINE 6 MG/2ML IV SOLN
12.0000 mg | Freq: Once | INTRAVENOUS | Status: AC
  Administered 2018-09-25: 12 mg via INTRAVENOUS
  Filled 2018-09-25: qty 4

## 2018-09-25 MED ORDER — SODIUM CHLORIDE 0.9 % IV BOLUS
1000.0000 mL | Freq: Once | INTRAVENOUS | Status: AC
  Administered 2018-09-25: 1000 mL via INTRAVENOUS

## 2018-09-25 NOTE — ED Notes (Addendum)
IV also removed from pt's left AC. IV removed was clean, dry and intact.

## 2018-09-25 NOTE — ED Provider Notes (Addendum)
[facility] EMERGENCY DEPARTMENT Provider Note   CSN: [id number] Arrival date & time: 09/25/18  1636    History   Chief Complaint Chief Complaint  Patient presents with  . Shortness of Breath  . Tachycardia    HPI Richard Meza is a 38 y.o. male with a pmh of psvt who presents to the emergency department with chief complaint of racing heart.  Patient states that he had onset of racing heart, shortness of breath around 5 AM when he got up to use bathroom.  Patient states that he tried holding his breath at home multiple times without movement.  His mother was at bedside also gives history and states that he has seen cardiology in the past however lost his job and does not currently have insurance so has not followed up this year and was trying to avoid coming to the emergency department.  Patient states about 3 hours ago he started feeling pressure in his chest along with the shortness of breath and racing heart.  He denies any active chest pain, he denies feelings of presyncope.  He does continue to feel short of breath.  He denies a history of DVT or pulmonary embolus.  He denies hemoptysis, unilateral leg swelling, recent injury, confinement or surgery.     HPI  Past Medical History:  Diagnosis Date  . History of chest pain   . History of palpitations   . History of shortness of breath     Patient Active Problem List   Diagnosis Date Noted  . Paroxysmal supraventricular tachycardia (HCC) 09/20/2014  . Murmur 09/20/2014  . Hyperlipidemia 09/20/2014    Past Surgical History:  Procedure Laterality Date  . LUNG SURGERY          Home Medications    Prior to Admission medications   Medication Sig Start Date End Date Taking? Authorizing Provider  aspirin 325 MG tablet Take 650 mg by mouth every 6 (six) hours as needed for mild pain or moderate pain.    [provider]  doxycycline (VIBRAMYCIN) 100 MG capsule Take 1 capsule (100 mg total) by mouth  2 (two) times daily. 07/20/17   Caccavale, Sophia, PA-C  ibuprofen (ADVIL,MOTRIN) 200 MG tablet Take 400 mg by mouth every 6 (six) hours as needed for mild pain or moderate pain.    [provider]    Family History Family History  Problem Relation Age of Onset  . Other Mother        health problems but not sure what.  . Alcoholism Father     Social History Social History   Tobacco Use  . Smoking status: Never Smoker  . Smokeless tobacco: Never Used  Substance Use Topics  . Alcohol use: No  . Drug use: No     Allergies   Patient has no known allergies.   Review of Systems Review of Systems  Ten systems reviewed and are negative for acute change, except as noted in the HPI.   Physical Exam Updated Vital Signs BP 111/86 (BP Location: Right Arm)   Pulse (!) 157   Temp 98.3 F (36.8 C) (Oral)   Resp (!) 22   SpO2 99%   Physical Exam Vitals signs and nursing note reviewed.  Constitutional:      General: He is not in acute distress.    Appearance: He is well-developed. He is not diaphoretic.  HENT:     Head: Normocephalic and atraumatic.  Eyes:     General:  No scleral icterus.    Conjunctiva/sclera: Conjunctivae normal.  Neck:     Musculoskeletal: Normal range of motion and neck supple.  Cardiovascular:     Rate and Rhythm: Regular rhythm. Tachycardia present.     Heart sounds: Normal heart sounds.  Pulmonary:     Effort: Pulmonary effort is normal. Tachypnea present. No respiratory distress.     Breath sounds: Normal breath sounds.  Abdominal:     Palpations: Abdomen is soft.     Tenderness: There is no abdominal tenderness.  Musculoskeletal:     Right lower leg: No edema.     Left lower leg: No edema.  Skin:    General: Skin is warm and dry.  Neurological:     Mental Status: He is alert.  Psychiatric:        Behavior: Behavior normal.      ED Treatments / Results  Labs (all labs ordered are listed, but only abnormal results are  displayed) Labs Reviewed  BASIC METABOLIC PANEL  CBC  TSH  I-STAT TROPONIN, ED    EKG None  Radiology No results found.  Procedures .Cardioversion Date/Time: 09/25/2018 5:12 PM Performed by: Arthor CaptainHarris, Roman Dubuc, PA-C Authorized by: Arthor CaptainHarris, Estephani Popper, PA-C   Consent:    Consent obtained:  Verbal   Consent given by:  Patient   Risks discussed:  Induced arrhythmia and pain   Alternatives discussed:  No treatment and rate-control medication Pre-procedure details:    Cardioversion basis:  Elective   Rhythm:  Supraventricular tachycardia   Electrode placement:  Anterior-posterior Patient sedated: No Attempt one:    Cardioversion mode attempt one: chemical    Shock outcome:  Conversion to normal sinus rhythm Post-procedure details:    Patient status:  Awake   Patient tolerance of procedure:  Tolerated well, no immediate complications  .Critical Care Performed by: Arthor CaptainHarris, Annaston Upham, PA-C Authorized by: Arthor CaptainHarris, Umer Harig, PA-C   Critical care provider statement:    Critical care time (minutes):  30   Critical care was necessary to treat or prevent imminent or life-threatening deterioration of the following conditions:  Cardiac failure and circulatory failure   Critical care was time spent personally by me on the following activities:  Discussions with consultants, evaluation of patient's response to treatment, examination of patient, ordering and performing treatments and interventions, ordering and review of laboratory studies, ordering and review of radiographic studies, pulse oximetry, re-evaluation of patient's condition, obtaining history from patient or surrogate and review of old charts   (including critical care time)  Medications Ordered in ED Medications  sodium chloride 0.9 % bolus 1,000 mL (has no administration in time range)  metoprolol tartrate (LOPRESSOR) injection 5 mg (has no administration in time range)  adenosine (ADENOCARD) 6 MG/2ML injection 12 mg (12 mg  Intravenous Given 09/25/18 1704)     Initial Impression / Assessment and Plan / ED Course  I have reviewed the triage vital signs and the nursing notes.  Pertinent labs & imaging results that were available during my care of the patient were reviewed by me and considered in my medical decision making (see chart for details).       Vitals:   09/25/18 1750 09/25/18 1755 09/25/18 1805 09/25/18 1810  BP: 127/82 127/75 129/74 136/84  Pulse: 85 86 90 89  Resp: 19 (!) 21 (!) 26 20  Temp:      TempSrc:      SpO2: 100% 100% 100% 100%    Patient here with SVT> Given adenosine with resolution of  SVT History gathered from patent, mother  At bedside and review of EMR Patient  had cp and sob which have completely resolved. Serial ekg confirms resolution of tachyarrhythmia. The patient's troponin is slightly elevated however this is to be expected with greater than 12 hours of sustained SVT. In this otherwise young and healthy male, I do not suspect ACS as the cause and feel that the cardiac injury is related to sustained rate and should normalize over time without long term damage.  Patient will be discharged with Lopressor.  CBC showed an elevated white blood cell count which I believe is likely acute phase reaction.  The patient advised to follow-up with cardiology in the next 2 weeks.  He appears appropriate for discharge at this time  Final Clinical Impressions(s) / ED Diagnoses   Final diagnoses:  SVT (supraventricular tachycardia) [facility])    ED Discharge Orders    None       Arthor Captain, PA-C 09/25/18 1956    Margarita Grizzle, MD 09/26/18 1427    Arthor Captain, PA-C 10/12/18 1106    Margarita Grizzle, MD 10/14/18 [phone]

## 2018-09-25 NOTE — Discharge Instructions (Signed)
Contact a health care provider if: °You have episodes of SVT more often than before. °Episodes of SVT last longer than before. °Vagus nerve stimulation is no longer helping. °You have new symptoms. °Get help right away if: °You have chest pain. °Your symptoms get worse. °You have trouble breathing. °You have an episode of SVT that lasts longer than 20 minutes. °You faint. °

## 2018-09-25 NOTE — ED Triage Notes (Signed)
Pt reports he has had shortness of breath with exertion, heart palpitations since this morning. Pt states he tried to walk and felt more short of breath.

## 2018-09-25 NOTE — ED Notes (Signed)
Patient verbalizes understanding of discharge instructions. Opportunity for questioning and answers were provided. Armband removed by staff, pt discharged from ED home via POV.  

## 2019-01-09 ENCOUNTER — Observation Stay (HOSPITAL_BASED_OUTPATIENT_CLINIC_OR_DEPARTMENT_OTHER): Payer: 59

## 2019-01-09 ENCOUNTER — Other Ambulatory Visit: Payer: Self-pay

## 2019-01-09 ENCOUNTER — Observation Stay (HOSPITAL_COMMUNITY): Payer: 59

## 2019-01-09 ENCOUNTER — Emergency Department (HOSPITAL_COMMUNITY): Payer: 59

## 2019-01-09 ENCOUNTER — Encounter (HOSPITAL_COMMUNITY): Payer: Self-pay

## 2019-01-09 ENCOUNTER — Observation Stay (HOSPITAL_COMMUNITY)
Admission: EM | Admit: 2019-01-09 | Discharge: 2019-01-10 | Disposition: A | Discharge status: Home / Self Care | Payer: 59 | Attending: Cardiology | Admitting: Cardiology

## 2019-01-09 DIAGNOSIS — I351 Nonrheumatic aortic (valve) insufficiency: Secondary | ICD-10-CM | POA: Diagnosis not present

## 2019-01-09 DIAGNOSIS — Z20828 Contact with and (suspected) exposure to other viral communicable diseases: Secondary | ICD-10-CM | POA: Insufficient documentation

## 2019-01-09 DIAGNOSIS — I471 Supraventricular tachycardia, unspecified: Secondary | ICD-10-CM | POA: Diagnosis present

## 2019-01-09 DIAGNOSIS — Z79899 Other long term (current) drug therapy: Secondary | ICD-10-CM | POA: Diagnosis not present

## 2019-01-09 DIAGNOSIS — R079 Chest pain, unspecified: Secondary | ICD-10-CM | POA: Diagnosis present

## 2019-01-09 DIAGNOSIS — I248 Other forms of acute ischemic heart disease: Secondary | ICD-10-CM | POA: Insufficient documentation

## 2019-01-09 DIAGNOSIS — I214 Non-ST elevation (NSTEMI) myocardial infarction: Secondary | ICD-10-CM

## 2019-01-09 DIAGNOSIS — R778 Other specified abnormalities of plasma proteins: Secondary | ICD-10-CM

## 2019-01-09 DIAGNOSIS — R7989 Other specified abnormal findings of blood chemistry: Secondary | ICD-10-CM | POA: Diagnosis not present

## 2019-01-09 DIAGNOSIS — I2489 Other forms of acute ischemic heart disease: Secondary | ICD-10-CM

## 2019-01-09 LAB — ECHOCARDIOGRAM COMPLETE
Height: 65 in
Weight: 3075.86 oz

## 2019-01-09 LAB — CBC
HCT: 45 % (ref 39.0–52.0)
Hemoglobin: 14.2 g/dL (ref 13.0–17.0)
MCH: 22.9 pg — ABNORMAL LOW (ref 26.0–34.0)
MCHC: 31.6 g/dL (ref 30.0–36.0)
MCV: 72.7 fL — ABNORMAL LOW (ref 80.0–100.0)
Platelets: 300 10*3/uL (ref 150–400)
RBC: 6.19 MIL/uL — ABNORMAL HIGH (ref 4.22–5.81)
RDW: 13.7 % (ref 11.5–15.5)
WBC: 11.2 10*3/uL — ABNORMAL HIGH (ref 4.0–10.5)
nRBC: 0 % (ref 0.0–0.2)

## 2019-01-09 LAB — HEMOGLOBIN A1C
Hgb A1c MFr Bld: 5.3 % (ref 4.8–5.6)
Mean Plasma Glucose: 105.41 mg/dL

## 2019-01-09 LAB — LIPID PANEL
Cholesterol: 151 mg/dL (ref 0–200)
HDL: 29 mg/dL — ABNORMAL LOW (ref >40)
LDL Cholesterol: 87 mg/dL (ref 0–99)
Total CHOL/HDL Ratio: 5.2 RATIO
Triglycerides: 174 mg/dL — ABNORMAL HIGH (ref <150)
VLDL: 35 mg/dL (ref 0–40)

## 2019-01-09 LAB — BASIC METABOLIC PANEL
Anion gap: 11 (ref 5–15)
BUN: 15 mg/dL (ref 6–20)
CO2: 22 mmol/L (ref 22–32)
Calcium: 9.6 mg/dL (ref 8.9–10.3)
Chloride: 106 mmol/L (ref 98–111)
Creatinine, Ser: 1.01 mg/dL (ref 0.61–1.24)
GFR calc Af Amer: >60 mL/min (ref >60)
GFR calc non Af Amer: >60 mL/min (ref >60)
Glucose, Bld: 125 mg/dL — ABNORMAL HIGH (ref 70–99)
Potassium: 3.9 mmol/L (ref 3.5–5.1)
Sodium: 139 mmol/L (ref 135–145)

## 2019-01-09 LAB — TSH: TSH: 0.545 u[IU]/mL (ref 0.350–4.500)

## 2019-01-09 LAB — HIV ANTIBODY (ROUTINE TESTING W REFLEX): HIV Screen 4th Generation wRfx: NONREACTIVE

## 2019-01-09 LAB — HEPARIN LEVEL (UNFRACTIONATED)
Heparin Unfractionated: 0.42 IU/mL (ref 0.30–0.70)
Heparin Unfractionated: 0.42 IU/mL (ref 0.30–0.70)

## 2019-01-09 LAB — SARS CORONAVIRUS 2: SARS Coronavirus 2: NOT DETECTED

## 2019-01-09 LAB — TROPONIN I
Troponin I: 0.34 ng/mL (ref <0.03)
Troponin I: 0.24 ng/mL (ref <0.03)

## 2019-01-09 MED ORDER — SODIUM CHLORIDE 0.9% FLUSH
3.0000 mL | Freq: Once | INTRAVENOUS | Status: AC
  Administered 2019-01-09: 02:00:00 3 mL via INTRAVENOUS

## 2019-01-09 MED ORDER — HEPARIN BOLUS VIA INFUSION
4000.0000 [IU] | Freq: Once | INTRAVENOUS | Status: AC
  Administered 2019-01-09: 05:00:00 4000 [IU] via INTRAVENOUS
  Filled 2019-01-09: qty 4000

## 2019-01-09 MED ORDER — METOPROLOL TARTRATE 25 MG PO TABS
12.5000 mg | ORAL_TABLET | Freq: Once | ORAL | Status: AC
  Administered 2019-01-09: 12.5 mg via ORAL
  Filled 2019-01-09: qty 1

## 2019-01-09 MED ORDER — ASPIRIN EC 325 MG PO TBEC
325.0000 mg | DELAYED_RELEASE_TABLET | Freq: Every day | ORAL | Status: DC
  Administered 2019-01-09 – 2019-01-10 (×2): 325 mg via ORAL
  Filled 2019-01-09 (×2): qty 1

## 2019-01-09 MED ORDER — SODIUM CHLORIDE 0.9 % IV SOLN
Freq: Once | INTRAVENOUS | Status: AC
  Administered 2019-01-09: 02:00:00 via INTRAVENOUS

## 2019-01-09 MED ORDER — ACETAMINOPHEN 325 MG PO TABS: 650.0000 mg | ORAL_TABLET | ORAL | Status: DC | PRN

## 2019-01-09 MED ORDER — IOHEXOL 350 MG/ML SOLN
90.0000 mL | Freq: Once | INTRAVENOUS | Status: AC | PRN
  Administered 2019-01-09: 17:00:00 90 mL via INTRAVENOUS

## 2019-01-09 MED ORDER — NITROGLYCERIN 0.4 MG SL SUBL
SUBLINGUAL_TABLET | SUBLINGUAL | Status: AC
  Administered 2019-01-09: 17:00:00 0.8 mg
  Filled 2019-01-09: qty 2

## 2019-01-09 MED ORDER — HEPARIN (PORCINE) 25000 UT/250ML-% IV SOLN
1200.0000 [IU]/h | INTRAVENOUS | Status: DC
  Administered 2019-01-09 (×2): 1200 [IU]/h via INTRAVENOUS
  Filled 2019-01-09 (×2): qty 250

## 2019-01-09 MED ORDER — ADENOSINE 6 MG/2ML IV SOLN
12.0000 mg | Freq: Once | INTRAVENOUS | Status: AC
  Administered 2019-01-09: 02:00:00 12 mg via INTRAVENOUS

## 2019-01-09 MED ORDER — ADENOSINE 6 MG/2ML IV SOLN
INTRAVENOUS | Status: AC
  Filled 2019-01-09: qty 6

## 2019-01-09 MED ORDER — ONDANSETRON HCL 4 MG/2ML IJ SOLN: 4.0000 mg | Freq: Four times a day (QID) | INTRAMUSCULAR | Status: DC | PRN

## 2019-01-09 MED ORDER — IOHEXOL 350 MG/ML SOLN
75.0000 mL | Freq: Once | INTRAVENOUS | Status: AC | PRN
  Administered 2019-01-09: 75 mL via INTRAVENOUS

## 2019-01-09 NOTE — ED Triage Notes (Signed)
Pt states that at 7pm he began to feel his heart racing, CP getting worse, some SOB and nausea, HR 168

## 2019-01-09 NOTE — ED Provider Notes (Signed)
[facility]   CSN: [id number] Arrival date & time: 01/09/19  0155     History   Chief Complaint Chief Complaint  Patient presents with  . Tachycardia    HPI Ridhaan Dalia HeadingBuon Yak is a 38 y.o. male.     The history is provided by the patient.  Palpitations Palpitations quality:  Fast Onset quality:  Sudden Duration:  5 hours Timing:  Constant Progression:  Unchanged Chronicity:  Recurrent Context: not anxiety, not appetite suppressants, not blood loss, not bronchodilators, not caffeine, not dehydration, not exercise, not hyperventilation, not illicit drugs, not nicotine and not stimulant use   Relieved by:  Nothing Worsened by:  Nothing Ineffective treatments:  None tried Associated symptoms: chest pressure, diaphoresis and nausea   Associated symptoms: no back pain and no cough   Risk factors: no diabetes mellitus   Patient with h/o chest pain and SVT comes in for chest pain and palpitation with SOB and diaphoresis.  No previous cardiac catheterization or stress test.    Past Medical History:  Diagnosis Date  . History of chest pain   . History of palpitations   . History of shortness of breath     Patient Active Problem List   Diagnosis Date Noted  . Paroxysmal supraventricular tachycardia (HCC) 09/20/2014  . Murmur 09/20/2014  . Hyperlipidemia 09/20/2014    Past Surgical History:  Procedure Laterality Date  . LUNG SURGERY          Home Medications    Prior to Admission medications   Medication Sig Start Date End Date Taking? Authorizing Provider  aspirin 325 MG tablet Take 650 mg by mouth every 6 (six) hours as needed for mild pain or moderate pain.    [provider]  doxycycline (VIBRAMYCIN) 100 MG capsule Take 1 capsule (100 mg total) by mouth 2 (two) times daily. 07/20/17   Caccavale, Sophia, PA-C  ibuprofen (ADVIL,MOTRIN) 200 MG tablet Take 400 mg by mouth every 6 (six) hours as needed for  mild pain or moderate pain.    [provider]  metoprolol tartrate (LOPRESSOR) 25 MG tablet Take 0.5 tablets (12.5 mg total) by mouth 2 (two) times daily. 09/25/18   Arthor CaptainHarris, Abigail, PA-C    Family History Family History  Problem Relation Age of Onset  . Other Mother        health problems but not sure what.  . Alcoholism Father     Social History Social History   Tobacco Use  . Smoking status: Never Smoker  . Smokeless tobacco: Never Used  Substance Use Topics  . Alcohol use: No  . Drug use: No     Allergies   Patient has no known allergies.   Review of Systems Review of Systems  Constitutional: Positive for diaphoresis. Negative for fever.  Respiratory: Negative for cough and stridor.   Cardiovascular: Positive for palpitations.  Gastrointestinal: Positive for nausea. Negative for abdominal pain.  Musculoskeletal: Negative for back pain.  All other systems reviewed and are negative.    Physical Exam Updated Vital Signs BP 117/74   Pulse 86   Temp 98.7 F (37.1 C) (Oral)   Resp (!) 24   SpO2 98%   Physical Exam Vitals signs and nursing note reviewed.  Constitutional:      General: He is not in acute distress.    Appearance: He is diaphoretic.  HENT:     Head: Normocephalic and atraumatic.     Nose: Nose normal.  Mouth/Throat:     Mouth: Mucous membranes are moist.     Pharynx: Oropharynx is clear.  Eyes:     Conjunctiva/sclera: Conjunctivae normal.     Pupils: Pupils are equal, round, and reactive to light.  Neck:     Musculoskeletal: Normal range of motion and neck supple.  Cardiovascular:     Rate and Rhythm: Regular rhythm. Tachycardia present.     Pulses: Normal pulses.     Heart sounds: Normal heart sounds.  Pulmonary:     Effort: Pulmonary effort is normal.     Breath sounds: Normal breath sounds.  Abdominal:     General: Abdomen is flat. Bowel sounds are normal.     Tenderness: There is no abdominal tenderness. There is no  guarding or rebound.  Musculoskeletal: Normal range of motion.     Right lower leg: No edema.     Left lower leg: No edema.  Skin:    General: Skin is warm.     Capillary Refill: Capillary refill takes less than 2 seconds.  Neurological:     General: No focal deficit present.     Mental Status: He is alert and oriented to person, place, and time.  Psychiatric:        Mood and Affect: Mood normal.        Behavior: Behavior normal.      ED Treatments / Results  Labs (all labs ordered are listed, but only abnormal results are displayed) Results for orders placed or performed during the hospital encounter of 01/09/19  Basic metabolic panel  Result Value Ref Range   Sodium 139 135 - 145 mmol/L   Potassium 3.9 3.5 - 5.1 mmol/L   Chloride 106 98 - 111 mmol/L   CO2 22 22 - 32 mmol/L   Glucose, Bld 125 (H) 70 - 99 mg/dL   BUN 15 6 - 20 mg/dL   Creatinine, Ser 4.091.01 0.61 - 1.24 mg/dL   Calcium 9.6 8.9 - 81.110.3 mg/dL   GFR calc non Af Amer >60 >60 mL/min   GFR calc Af Amer >60 >60 mL/min   Anion gap 11 5 - 15  CBC  Result Value Ref Range   WBC 11.2 (H) 4.0 - 10.5 K/uL   RBC 6.19 (H) 4.22 - 5.81 MIL/uL   Hemoglobin 14.2 13.0 - 17.0 g/dL   HCT 91.445.0 78.239.0 - 95.652.0 %   MCV 72.7 (L) 80.0 - 100.0 fL   MCH 22.9 (L) 26.0 - 34.0 pg   MCHC 31.6 30.0 - 36.0 g/dL   RDW 21.313.7 08.611.5 - 57.815.5 %   Platelets 300 150 - 400 K/uL   nRBC 0.0 0.0 - 0.2 %  Troponin I - ONCE - STAT  Result Value Ref Range   Troponin I 0.34 (HH) <0.03 ng/mL   Dg Chest Portable 1 View  Result Date: 01/09/2019 CLINICAL DATA:  Chest pain EXAM: PORTABLE CHEST 1 VIEW COMPARISON:  09/25/2018 FINDINGS: Heart is borderline enlarged. Lungs clear. No effusions or edema. No acute bony abnormality. IMPRESSION: No active disease. Electronically Signed   By: Charlett NoseKevin  Dover M.D.   On: 01/09/2019 02:22    Dg Chest Portable 1 View  Result Date: 01/09/2019 CLINICAL DATA:  Chest pain EXAM: PORTABLE CHEST 1 VIEW COMPARISON:  09/25/2018  FINDINGS: Heart is borderline enlarged. Lungs clear. No effusions or edema. No acute bony abnormality. IMPRESSION: No active disease. Electronically Signed   By: Charlett NoseKevin  Dover M.D.   On: 01/09/2019 02:22    EKG EKG  Interpretation  Date/Time:  Monday January 09 2019 02:19:58 EDT Ventricular Rate:  87 PR Interval:    QRS Duration: 110 QT Interval:  390 QTC Calculation: 470 R Axis:   22 Text Interpretation:  Sinus rhythm Non specific st changes Confirmed by Dory Horn) on 01/09/2019 2:23:14 AM   Radiology Dg Chest Portable 1 View  Result Date: 01/09/2019 CLINICAL DATA:  Chest pain EXAM: PORTABLE CHEST 1 VIEW COMPARISON:  09/25/2018 FINDINGS: Heart is borderline enlarged. Lungs clear. No effusions or edema. No acute bony abnormality. IMPRESSION: No active disease. Electronically Signed   By: Rolm Baptise M.D.   On: 01/09/2019 02:22    Procedures Procedures (including critical care time)  Medications Ordered in ED Medications  heparin ADULT infusion 100 units/mL (25000 units/284mL sodium chloride 0.45%) (has no administration in time range)  heparin bolus via infusion 4,000 Units (has no administration in time range)  sodium chloride flush (NS) 0.9 % injection 3 mL (3 mLs Intravenous Given 01/09/19 0215)  adenosine (ADENOCARD) 6 MG/2ML injection 12 mg (12 mg Intravenous Given 01/09/19 0210)  0.9 %  sodium chloride infusion ( Intravenous New Bag/Given 01/09/19 0213)  metoprolol tartrate (LOPRESSOR) tablet 12.5 mg (12.5 mg Oral Given 01/09/19 0243)    Successful conversion with 12 mg of adenosine.     MDM Reviewed: previous chart, nursing note and vitals Reviewed previous: labs Interpretation: labs, ECG and x-ray (positive troponin, Cardiolomegaly by me ) Total time providing critical care: 30-74 minutes (heparin drip). This excludes time spent performing separately reportable procedures and services. Consults: cardiology  CRITICAL CARE Performed by: Prentice Sackrider K Megan Presti-Rasch  Total critical care time: 60 minutes Critical care time was exclusive of separately billable procedures and treating other patients. Critical care was necessary to treat or prevent imminent or life-threatening deterioration. Critical care was time spent personally by me on the following activities: development of treatment plan with patient and/or surrogate as well as nursing, discussions with consultants, evaluation of patient's response to treatment, examination of patient, obtaining history from patient or surrogate, ordering and performing treatments and interventions, ordering and review of laboratory studies, ordering and review of radiographic studies, pulse oximetry and re-evaluation of patient's condition.  Final Clinical Impressions(s) / ED Diagnoses   Admit to cardiology, patient discussed with Collier Salina of cardiology    Kinsey, Jaszmine Navejas, MD 01/09/19 [phone]

## 2019-01-09 NOTE — Progress Notes (Signed)
    Brief cardiology note.  Patient seen and examined today.  H&P from this morning reviewed.  Patient with known history of PSVT presenting now with prolonged episode of sustained tachycardia with chest discomfort and diaphoresis.  Had mild troponin elevation that is most likely consistent with demand ischemia.  Currently on IV heparin with consideration for catheterization based on fellow note.  My suspicion is this is probably not a non-STEMI/ACS and more consistent with demand ischemia.  Troponin levels are already trending down.  Currently chest pain-free.   Plan for now:  Continue IV heparin for present troponins.  Hold off on cardiac catheterization, preferably do coronary CT angiogram  Check 2D echocardiogram (I did not hear much of a diastolic murmur on exam   We will discuss with EP team determine potential pill in pocket therapy for further breakthrough.  We will likely start low-dose beta-blocker --> he will need follow-up with Dr. Rayann Heman.   Glenetta Hew, M.D., M.S. Interventional Cardiologist   Pager # [phone] Phone # [phone] [address] [place], [place] [address]

## 2019-01-09 NOTE — Progress Notes (Signed)
ANTICOAGULATION CONSULT NOTE - Initial Consult  Pharmacy Consult for heparin Indication: atrial fibrillation  No Known Allergies  Patient Measurements:     Vital Signs: Temp: 98.7 F (37.1 C) (06/15 0201) Temp Source: Oral (06/15 0201) BP: 115/75 (06/15 0300) Pulse Rate: 84 (06/15 0300)  Labs: Recent Labs    01/09/19 0205  HGB 14.2  HCT 45.0  PLT 300  CREATININE 1.01  TROPONINI 0.34*    CrCl cannot be calculated (Unknown ideal weight.).   Medical History: Past Medical History:  Diagnosis Date  . History of chest pain   . History of palpitations   . History of shortness of breath     Medications:  See medication history  Assessment: 38 yo man to start heparin for afib.  He was not on anticoagulation PTA Goal of Therapy:  Heparin level 0.3-0.7 units/ml Monitor platelets by anticoagulation protocol: Yes   Plan:  Heparin bolus 4000 units and drip at 1200 units/hr Check heparin level 6-8 hours after start Daily HL and CBC while on heparin Monitor for bleeding complications   Richard Meza 01/09/2019,4:29 AM

## 2019-01-09 NOTE — Progress Notes (Signed)
ANTICOAGULATION CONSULT NOTE  Pharmacy Consult for heparin Indication: atrial fibrillation  No Known Allergies  Patient Measurements: Height: 5\' 5"  (165.1 cm) Weight: 192 lb 3.9 oz (87.2 kg) IBW/kg (Calculated) : 61.5   Vital Signs: Temp: 97.7 F (36.5 C) (06/15 0631) Temp Source: Oral (06/15 0631) BP: 119/78 (06/15 0631) Pulse Rate: 72 (06/15 0631)  Labs: Recent Labs    01/09/19 0205 01/09/19 0615 01/09/19 1045  HGB 14.2  --   --   HCT 45.0  --   --   PLT 300  --   --   HEPARINUNFRC  --   --  0.42  CREATININE 1.01  --   --   TROPONINI 0.34* 0.24*  --     Estimated Creatinine Clearance: 99.7 mL/min (by C-G formula based on SCr of 1.01 mg/dL).   Medical History: Past Medical History:  Diagnosis Date  . History of chest pain   . History of palpitations   . History of shortness of breath     Medications:  See medication history   Assessment: 38 yo man to start heparin for afib.  He was not on anticoagulation PTA.  Heparin level came back therapeutic at 0.42, on 1200 units/hr. Hgb 14.2, plt 300. Trop 0.24. No s/sx of bleeding. No infusion issues.  Goal of Therapy:  Heparin level 0.3-0.7 units/ml Monitor platelets by anticoagulation protocol: Yes   Plan:  Continue heparin drip at 1200 units/hr Check heparin level 6-8 hours  Daily HL and CBC while on heparin Monitor for bleeding complications  Antonietta Jewel, PharmD, BCCCP Clinical Pharmacist  Pager: [phone] Phone: [phone] 01/09/2019,11:30 AM

## 2019-01-09 NOTE — H&P (Signed)
CARDIOLOGY H&P  HPI: Richard Meza is a 38 y.o. male w/ history of SVT who presents with SVT and chest pain.  The patient has had a history of palpitations for at least the past several years.  He has seen a doctor about this before and has been prescribed a beta-blocker to be taken as needed at home.  He states that he only typically has symptoms like this about 2 or 3 times per year and it is typically not too bothersome for him.  Last night, the patient developed his typical symptoms of palpitations while driving, however this time he also developed chest pressure and diaphoresis.  He had never had chest pressure before, however does think that he has had some diaphoresis on prior episodes of palpitations.  His symptoms lasted for approximately 20 to 30 minutes and improved but he feels as though they did not completely resolve.  After going home and having a period of rest, he again developed recurrent symptoms of palpitations with chest pressure and diaphoresis.  He thus came to the emergency department for evaluation.  In the ED the patient was found to be in narrow complex tachycardia on twelve-lead ECG.  He was given 12 mg of adenosine x1 and his rhythm reverted to normal sinus.  Labs were checked and he was found to have an elevated troponin at 0.34.  He was thus admitted to cardiology for further work-up and management.  Notably, the patient has an approximately 1 year history of smoking but quit several years ago.  He has no known history of diabetes or any other cardiovascular conditions.  He does not believe that any of his first-degree family members have any history of cardiovascular disease.  Review of Systems:     Cardiac Review of Systems: {Y] = yes [ ]  = no  Chest Pain [Y   ]  Resting SOB [   ] Exertional SOB  [  ]  Orthopnea [  ]   Pedal Edema [   ]    Palpitations [Y] Syncope  [  ]   Presyncope [   ]  General Review of Systems: [Y] = yes [  ]=no Constitional: recent weight  change [  ]; anorexia [  ]; fatigue [  ]; nausea [  ]; night sweats [  ]; fever [  ]; or chills [  ];                                                                     Dental: poor dentition[  ];   Eye : blurred vision [  ]; diplopia [   ]; vision changes [  ];  Amaurosis fugax[  ]; Resp: cough [  ];  wheezing[  ];  hemoptysis[  ]; shortness of breath[  ]; paroxysmal nocturnal dyspnea[  ]; dyspnea on exertion[  ]; or orthopnea[  ];  GI:  gallstones[  ], vomiting[  ];  dysphagia[  ]; melena[  ];  hematochezia [  ]; heartburn[  ];   GU: kidney stones [  ]; hematuria[  ];   dysuria [  ];  nocturia[  ];               Skin: rash [  ],  swelling[  ];, hair loss[  ];  peripheral edema[  ];  or itching[  ]; Musculosketetal: myalgias[  ];  joint swelling[  ];  joint erythema[  ];  joint pain[  ];  back pain[  ];  Heme/Lymph: bruising[  ];  bleeding[  ];  anemia[  ];  Neuro: TIA[  ];  headaches[  ];  stroke[  ];  vertigo[  ];  seizures[  ];   paresthesias[  ];  difficulty walking[  ];  Psych:depression[  ]; anxiety[  ];  Endocrine: diabetes[  ];  thyroid dysfunction[  ];  Other:  Past Medical History:  Diagnosis Date  . History of chest pain   . History of palpitations   . History of shortness of breath     Prior to Admission medications   Medication Sig Start Date End Date Taking? Authorizing Provider  aspirin 325 MG tablet Take 650 mg by mouth every 6 (six) hours as needed for mild pain or moderate pain.    [provider]  doxycycline (VIBRAMYCIN) 100 MG capsule Take 1 capsule (100 mg total) by mouth 2 (two) times daily. 07/20/17   Caccavale, Sophia, PA-C  ibuprofen (ADVIL,MOTRIN) 200 MG tablet Take 400 mg by mouth every 6 (six) hours as needed for mild pain or moderate pain.    [provider]  metoprolol tartrate (LOPRESSOR) 25 MG tablet Take 0.5 tablets (12.5 mg total) by mouth 2 (two) times daily. 09/25/18   Arthor CaptainHarris, Abigail, PA-C    No Known Allergies  Social History    Socioeconomic History  . Marital status: Married    Spouse name: Not on file  . Number of children: Not on file  . Years of education: Not on file  . Highest education level: Not on file  Occupational History  . Not on file  Social Needs  . Financial resource strain: Not on file  . Food insecurity    Worry: Not on file    Inability: Not on file  . Transportation needs    Medical: Not on file    Non-medical: Not on file  Tobacco Use  . Smoking status: Never Smoker  . Smokeless tobacco: Never Used  Substance and Sexual Activity  . Alcohol use: No  . Drug use: No  . Sexual activity: Not on file  Lifestyle  . Physical activity    Days per week: Not on file    Minutes per session: Not on file  . Stress: Not on file  Relationships  . Social Musicianconnections    Talks on phone: Not on file    Gets together: Not on file    Attends religious service: Not on file    Active member of club or organization: Not on file    Attends meetings of clubs or organizations: Not on file    Relationship status: Not on file  . Intimate partner violence    Fear of current or ex partner: Not on file    Emotionally abused: Not on file    Physically abused: Not on file    Forced sexual activity: Not on file  Other Topics Concern  . Not on file  Social History Narrative   [place] in [place] and works at [employer] in maintenance    Family History  Problem Relation Age of Onset  . Other Mother        health problems but not sure what.  . Alcoholism Father     PHYSICAL EXAM: Vitals:  01/09/19 0400 01/09/19 0430  BP: 109/74 108/72  Pulse: 77 73  Resp: (!) 24 (!) 23  Temp:    SpO2: 99% 99%   General:  Well appearing. No respiratory difficulty HEENT: normal Neck: supple. no JVD. Carotids 2+ bilat; no bruits. No lymphadenopathy or thryomegaly appreciated. Cor: PMI nondisplaced. Regular rate & rhythm.  2 of 4 holodiastolic murmur heard best at the apex. Lungs: clear Abdomen: soft,  nontender, nondistended. No hepatosplenomegaly. No bruits or masses. Good bowel sounds. Extremities: no cyanosis, clubbing, edema Neuro: alert & oriented x 3, cranial nerves grossly intact. moves all 4 extremities w/o difficulty. Affect pleasant.  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  Follow-up ECG with normal sinus rhythm at 87 bpm and nonspecific ST changes likely consistent with J-point elevation.  Results for orders placed or performed during the hospital encounter of 01/09/19 (from the past 24 hour(s))  Basic metabolic panel     Status: Abnormal   Collection Time: 01/09/19  2:05 AM  Result Value Ref Range   Sodium 139 135 - 145 mmol/L   Potassium 3.9 3.5 - 5.1 mmol/L   Chloride 106 98 - 111 mmol/L   CO2 22 22 - 32 mmol/L   Glucose, Bld 125 (H) 70 - 99 mg/dL   BUN 15 6 - 20 mg/dL   Creatinine, Ser 1.611.01 0.61 - 1.24 mg/dL   Calcium 9.6 8.9 - 09.610.3 mg/dL   GFR calc non Af Amer >60 >60 mL/min   GFR calc Af Amer >60 >60 mL/min   Anion gap 11 5 - 15  CBC     Status: Abnormal   Collection Time: 01/09/19  2:05 AM  Result Value Ref Range   WBC 11.2 (H) 4.0 - 10.5 K/uL   RBC 6.19 (H) 4.22 - 5.81 MIL/uL   Hemoglobin 14.2 13.0 - 17.0 g/dL   HCT 04.545.0 40.939.0 - 81.152.0 %   MCV 72.7 (L) 80.0 - 100.0 fL   MCH 22.9 (L) 26.0 - 34.0 pg   MCHC 31.6 30.0 - 36.0 g/dL   RDW 91.413.7 78.211.5 - 95.615.5 %   Platelets 300 150 - 400 K/uL   nRBC 0.0 0.0 - 0.2 %  Troponin I - ONCE - STAT     Status: Abnormal   Collection Time: 01/09/19  2:05 AM  Result Value Ref Range   Troponin I 0.34 (HH) <0.03 ng/mL   Dg Chest Portable 1 View  Result Date: 01/09/2019 CLINICAL DATA:  Chest pain EXAM: PORTABLE CHEST 1 VIEW COMPARISON:  09/25/2018 FINDINGS: Heart is borderline enlarged. Lungs clear. No effusions or edema. No acute bony abnormality. IMPRESSION: No active disease. Electronically Signed   By: Charlett NoseKevin  Dover M.D.   On: 01/09/2019 02:22   ASSESSMENT: Richard Meza is a 38 y.o. male w/ history of SVT who presents with SVT and chest pain, found to have elevated troponin.  The patient's story does raise concern for obstructive coronary disease despite the fact that he has few risk factors for this. It seems most appropriate for the patient to undergo cardiac catheterization to rule out obstructive epicardial coronary disease prior to making a diagnosis of Type 2 demand mediated MI in the setting of SVT.  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

## 2019-01-09 NOTE — Progress Notes (Signed)
  Echocardiogram 2D Echocardiogram has been performed.  Richard Meza 01/09/2019, 8:39 AM

## 2019-01-09 NOTE — Progress Notes (Signed)
ANTICOAGULATION CONSULT NOTE - Follow Up Consult  Pharmacy Consult for Heparin Indication: chest pain/ACS  No Known Allergies  Patient Measurements: Height: 5\' 5"  (165.1 cm) Weight: 192 lb 3.9 oz (87.2 kg) IBW/kg (Calculated) : 61.5 Heparin Dosing Weight:  80 kg  Vital Signs:    Labs: Recent Labs    01/09/19 0205 01/09/19 0615 01/09/19 1045 01/09/19 1827  HGB 14.2  --   --   --   HCT 45.0  --   --   --   PLT 300  --   --   --   HEPARINUNFRC  --   --  0.42 0.42  CREATININE 1.01  --   --   --   TROPONINI 0.34* 0.24*  --   --     Estimated Creatinine Clearance: 99.7 mL/min (by C-G formula based on SCr of 1.01 mg/dL).   Assessment:  Anticoag: Hep gtt for Afib/ACS - no AC PTA. HL 0.42, repeat 0.42 again. -Trop 0.34>0.24  Goal of Therapy:  Heparin level 0.3-0.7 units/ml Monitor platelets by anticoagulation protocol: Yes   Plan:  Heparin infusion at 1200 units/hr Daily HL and CBC  Keagon Glascoe S. Alford Highland, PharmD, BCPS Clinical Staff Pharmacist Eilene Ghazi Stillinger 01/09/2019,7:01 PM

## 2019-01-10 DIAGNOSIS — I351 Nonrheumatic aortic (valve) insufficiency: Secondary | ICD-10-CM

## 2019-01-10 DIAGNOSIS — I471 Supraventricular tachycardia: Secondary | ICD-10-CM | POA: Diagnosis not present

## 2019-01-10 DIAGNOSIS — I248 Other forms of acute ischemic heart disease: Secondary | ICD-10-CM | POA: Diagnosis not present

## 2019-01-10 DIAGNOSIS — R778 Other specified abnormalities of plasma proteins: Secondary | ICD-10-CM

## 2019-01-10 LAB — CBC
HCT: 41.2 % (ref 39.0–52.0)
Hemoglobin: 13.2 g/dL (ref 13.0–17.0)
MCH: 23.4 pg — ABNORMAL LOW (ref 26.0–34.0)
MCHC: 32 g/dL (ref 30.0–36.0)
MCV: 72.9 fL — ABNORMAL LOW (ref 80.0–100.0)
Platelets: 221 10*3/uL (ref 150–400)
RBC: 5.65 MIL/uL (ref 4.22–5.81)
RDW: 13.7 % (ref 11.5–15.5)
WBC: 7.1 10*3/uL (ref 4.0–10.5)
nRBC: 0 % (ref 0.0–0.2)

## 2019-01-10 LAB — HEPARIN LEVEL (UNFRACTIONATED): Heparin Unfractionated: 0.43 IU/mL (ref 0.30–0.70)

## 2019-01-10 MED ORDER — METOPROLOL TARTRATE 25 MG PO TABS: 12.5000 mg | ORAL_TABLET | Freq: Two times a day (BID) | ORAL | 2 refills | Status: DC

## 2019-01-10 MED ORDER — METOPROLOL TARTRATE 12.5 MG HALF TABLET
12.5000 mg | ORAL_TABLET | Freq: Two times a day (BID) | ORAL | Status: DC
  Administered 2019-01-10: 12.5 mg via ORAL
  Filled 2019-01-10: qty 1

## 2019-01-10 NOTE — Progress Notes (Addendum)
Progress Note  Patient Name: Richard Meza Date of Encounter: 01/10/2019  Primary Cardiologist: Charlton HawsPeter Nishan, MD / Hillis RangeJames Allred, MD  Subjective   No significant overnight events. No further palpitations, chest pain, diaphoresis, lightheadedness, or dizziness. Patient has no questions or concerns at this time.  Inpatient Medications    Scheduled Meds:  aspirin EC  325 mg Oral Daily   Continuous Infusions:  heparin 1,200 Units/hr (01/09/19 2209)   PRN Meds: acetaminophen, ondansetron (ZOFRAN) IV   Vital Signs    Vitals:   01/09/19 0430 01/09/19 0631 01/09/19 2029 01/10/19 0645  BP: 108/72 119/78 113/62 (!) 108/54  Pulse: 73 72 74 85  Resp: (!) 23 16 14 20   Temp:  97.7 F (36.5 C) 97.6 F (36.4 C) 97.8 F (36.6 C)  TempSrc:  Oral Oral Oral  SpO2: 99% 100% 98% 96%  Weight:  87.2 kg    Height:  5\' 5"  (1.651 m)      Intake/Output Summary (Last 24 hours) at 01/10/2019 0659 Last data filed at 01/10/2019 0300 Gross per 24 hour  Intake 456 ml  Output --  Net 456 ml   Filed Weights   01/09/19 0631  Weight: 87.2 kg    Telemetry    Sinus rhythm with heart rates in the 60's to 90's. - Personally Reviewed  ECG    No new ECG tracing today. - Personally Reviewed  Physical Exam   GEN: 38 year old male resting comfortably. Alert and in no acute distress.   Neck: Supple.  Cardiac: RRR. No murmurs, gallops, or rubs.  Respiratory: Clear to auscultation bilaterally. No wheezes, rhonchi, or rales. GI: Abdomen soft, non-distended, and non-tender. Bowel sounds present in all 4 quadrants. Extremities: No lower extremity edema. No deformity. Radial and distal pedal pulses 2+ and equal bilaterally. Skin: Warm and dry. Neuro:  No focal deficits. Psych: Normal affect. Responds appropriately.  Labs    Chemistry Recent Labs  Lab 01/09/19 0205  NA 139  K 3.9  CL 106  CO2 22  GLUCOSE 125*  BUN 15  CREATININE 1.01  CALCIUM 9.6  GFRNONAA >60  GFRAA >60  ANIONGAP 11      Hematology Recent Labs  Lab 01/09/19 0205 01/10/19 0441  WBC 11.2* 7.1  RBC 6.19* 5.65  HGB 14.2 13.2  HCT 45.0 41.2  MCV 72.7* 72.9*  MCH 22.9* 23.4*  MCHC 31.6 32.0  RDW 13.7 13.7  PLT 300 221    Cardiac Enzymes Recent Labs  Lab 01/09/19 0205 01/09/19 0615  TROPONINI 0.34* 0.24*   No results for input(s): TROPIPOC in the last 168 hours.   BNPNo results for input(s): BNP, PROBNP in the last 168 hours.   DDimer No results for input(s): DDIMER in the last 168 hours.   Radiology    Ct Angio Chest Pe W And/or Wo Contrast  Result Date: 01/09/2019 CLINICAL DATA:  Chest pain, shortness of breath, tachycardia. EXAM: CT ANGIOGRAPHY CHEST WITH CONTRAST TECHNIQUE: Multidetector CT imaging of the chest was performed using the standard protocol during bolus administration of intravenous contrast. Multiplanar CT image reconstructions and MIPs were obtained to evaluate the vascular anatomy. CONTRAST:  75mL OMNIPAQUE IOHEXOL 350 MG/ML SOLN COMPARISON:  Chest radiograph 01/09/2019 FINDINGS: Cardiovascular: Pulmonary arterial opacification is adequate without evidence of emboli. There is cardiomegaly with notable left ventricular enlargement. No pericardial effusion. Normal caliber of the thoracic aorta. Mediastinum/Nodes: No enlarged axillary, mediastinal, or hilar lymph nodes. Collapsed esophagus. Grossly unremarkable included thyroid within limitations of streak  artifact. Lungs/Pleura: No pleural effusion or pneumothorax. Suture line in the right middle lobe along the minor fissure. Mild dependent atelectasis in the lower lobes. Upper Abdomen: Unremarkable. Musculoskeletal: No acute osseous abnormality or suspicious osseous lesion. Review of the MIP images confirms the above findings. IMPRESSION: 1. No evidence of pulmonary emboli or other acute abnormality in the chest. 2. Cardiomegaly. Electronically Signed   By: Logan Bores M.D.   On: 01/09/2019 06:03   Ct Coronary Morph W/cta Cor  W/score W/ca W/cm &/or Wo/cm  Result Date: 01/09/2019 HISTORY: Chest pain, possible bicuspid aortic valve, aortic regurgitation EXAM: Cardiac/Coronary  CT TECHNIQUE: The patient was scanned on a [employer]. PROTOCOL: A 120 kV prospective scan was triggered in the descending thoracic aorta at 111 HU's. Axial non-contrast 3 mm slices were carried out through the heart. The data set was analyzed on a dedicated work station and scored using the [employer]. Gantry rotation speed was 250 msecs and collimation was .6 mm. Beta blockade and 0.8 mg of sl NTG was given. The 3D data set was reconstructed in 5% intervals of the 67-82 % of the R-R cycle. Diastolic phases were analyzed on a dedicated work station using MPR, MIP and VRT modes. The patient received 72mL OMNIPAQUE IOHEXOL 350 MG/ML SOLN of contrast. FINDINGS: Coronary calcium score: The patient's coronary artery calcium score is 0 Coronary arteries: Normal coronary origins.  Right dominance. Right Coronary Artery: No detectable plaque or stenosis. Left Main Coronary Artery: No detectable plaque or stenosis. Left Anterior Descending Coronary Artery: No detectable plaque or stenosis. Left Circumflex Artery: No detectable plaque or stenosis. Aorta: No calcifications. No dissection. No coarctation of the aorta. Conventional 3 vessel branch pattern of the aortic arch. Normal takeoff of the left subclavian artery. Measurements of Aorta made double oblique: Sinuses of Valsalva: R-L: 41 mm, R-Non: 41 mm, L-Non: 43 mm ST Junction: 35 mm Mid ascending aorta (level of PA bifurcation): 39 mm Distal ascending aorta (proximal to right brachiocephalic artery): 37 mm Arch (between left common carotid and left subclavian): 30 mm Proximal descending aorta: 33 mm Mid descending aorta (at level of pulmonary veins): 26 mm Distal descending aorta (just above aortic hiatus): 26 mm Aortic Valve: Trileaflet (see series 489 for representative image). No calcifications. Normal  leaflet excursion. There may be a very tiny amount of fusion between right and left coronary cusps, possibly signifying a form fruste bicuspid aortic valve, however this is not definite. Valve appears to be functionally trileaflet. Other findings: Normal pulmonary vein drainage into the left atrium. Normal left atrial appendage without a thrombus. Normal size of the pulmonary artery. Two papillary muscles are seen in LV, mitral valve is within normal limits. Normal biventricular function. LV end diastolic dimension: 60 mm, moderately dilated. LV end systolic dimension: 43 mm. IMPRESSION: 1. No evidence of CAD, CADRADS = 0. 2. Coronary calcium score of 0. This was 0 percentile for age and sex matched control. 3. Normal coronary origin with right dominance. 4.  Trileaflet aortic valve with normal leaflet excursion. 5. Mildly dilated sinuses of Valsalva, 43 mm from L-Non coronary cusp. Mid ascending aorta measures 39 mm. 6. Moderately dilated left ventricle, end diastolic dimension 60 mm. End systolic dimension 43 mm. Electronically Signed   By: Cherlynn Kaiser   On: 01/09/2019 19:39   Dg Chest Portable 1 View  Result Date: 01/09/2019 CLINICAL DATA:  Chest pain EXAM: PORTABLE CHEST 1 VIEW COMPARISON:  09/25/2018 FINDINGS: Heart is borderline enlarged. Lungs clear. No  effusions or edema. No acute bony abnormality. IMPRESSION: No active disease. Electronically Signed   By: Charlett NoseKevin  Dover M.D.   On: 01/09/2019 02:22    Cardiac Studies   Coronary CT 01/09/2019: Impression: 1. No evidence of CAD, CADRADS = 0. 2. Coronary calcium score of 0. This was 0 percentile for age and sex matched control. 3. Normal coronary origin with right dominance. 4.  Trileaflet aortic valve with normal leaflet excursion. 5. Mildly dilated sinuses of Valsalva, 43 mm from L-Non coronary cusp. Mid ascending aorta measures 39 mm. 6. Moderately dilated left ventricle, end diastolic dimension 60 mm. End systolic dimension 43  mm. _______________  Echocardiogram 01/09/2019: Impression: 1. The left ventricle has normal systolic function, with an ejection fraction of 55-60%. The cavity size was mildly dilated. LV end systolic dimension 41 mm. There is mildly increased left ventricular wall thickness. Medial e' is 9.9 cm/s, suggesting  normal diastolic function despite nondiagnostic mitral inflow pattern. LV endocardial borders not well defined to exclude regional wall motion abnormalties, however grossly normal wall motion is noted.  2. The right ventricle has normal systolic function. The cavity was normal. There is no increase in right ventricular wall thickness.  3. The aortic valve has an indeterminate number of cusps. Mild sclerosis of the aortic valve. Aortic valve regurgitation is challenging to quantitate, in parasternal long axis and short axis visually appears moderate by color flow Doppler.  4. There is mild dilatation at the level of the sinuses of Valsalva measuring 40 mm. Ascending aorta is mildly dilated at 38 mm.  5. Left atrial size was is visually normal to mildly dilated, however volume measurements suggest severe dilation. This may be secondary to acquisition window and measurement technique. Previously measured and described as normal.  6. When compared to the prior study: 05/21/2017 - no significant change in LV size, LV function, or degree of aortic regurgitation by color flow Doppler. Aorta dimensions suggest possible mild increase in dimension, however may vary by measurement  technique.  Summary: Patient is scheduled for coronary CT angiogram, consider ECG gated assessment of thoracic aorta during this exam, and retrospective gating to examine aortic valve morphology.  Patient Profile   Mr. Richard Meza is a 38 y.o. male with a history of paroxysmal SVT who presented on 01/09/2019 for evaluation of palpitations and chest pain.   Assessment & Plan    1. SVT - Resolved after 1 dose of Adenosine  12mg .  - Telemetry shows sinus rhythm with rates in the 60's to 90's. No additional tachyarrhythmias.  - TSH normal. - Echo showed LVEF of 55-60% with mildly dilated cavity and grossly normal wall motion. - Per yesterday's note, Dr. Herbie BaltimoreHarding was going to discuss with MD potential pill in pocket therapy for further breakthrough.  - Will likely restart home Lopressor 12.5mg  twice daily. - Will need follow-up with EP (Dr. Johney FrameAllred).  2. Demand Ischemia - Troponin mildly elevated at 0.34 >> 0.24. - Echo as above. - Coronary CT showed no evidence of CAD. - Elevated troponin due to demand ischemia secondary to SVT. Will stop the Heparin.  For questions or updates, please contact [facility] Please consult [email or web address] for contact info under Cardiology/STEMI.      Signed, Corrin Parkerallie E Rafaela Dinius, PA-C  01/10/2019, 6:59 AM   Pager: [phone]

## 2019-01-10 NOTE — Discharge Summary (Signed)
Discharge Summary    Patient ID: Richard Meza MRN: [id number]; DOB: 1981/07/21  Admit date: 01/09/2019 Discharge date: 01/10/2019  Primary Care Provider: Darrin Nipperollege, [facility] [place]  Primary Cardiologist: Charlton HawsPeter Nishan, MD  Primary Electrophysiologist:  Hillis RangeJames Allred, MD   Discharge Diagnoses    Principal Problem:   Paroxysmal supraventricular tachycardia [facility](HCC) Active Problems:   Elevated troponin   Demand ischemia [facility](HCC)   Aortic regurgitation   Allergies No Known Allergies  Diagnostic Studies/Procedures    Echocardiogram 01/09/2019: Impression: 1. The left ventricle has normal systolic function, with an ejection fraction of 55-60%. The cavity size was mildly dilated. LV end systolic dimension 41 mm. There is mildly increased left ventricular wall thickness. Medial e' is 9.9 cm/s, suggesting  normal diastolic function despite nondiagnostic mitral inflow pattern. LV endocardial borders not well defined to exclude regional wall motion abnormalties, however grossly normal wall motion is noted. 2. The right ventricle has normal systolic function. The cavity was normal. There is no increase in right ventricular wall thickness. 3. The aortic valve has an indeterminate number of cusps. Mild sclerosis of the aortic valve. Aortic valve regurgitation is challenging to quantitate, in parasternal long axis and short axis visually appears moderate by color flow Doppler. 4. There is mild dilatation at the level of the sinuses of Valsalva measuring 40 mm. Ascending aorta is mildly dilated at 38 mm. 5. Left atrial size was is visually normal to mildly dilated, however volume measurements suggest severe dilation. This may be secondary to acquisition window and measurement technique. Previously measured and described as normal. 6. When compared to the prior study: 05/21/2017 - no significant change in LV size, LV function, or degree of aortic regurgitation by color flow Doppler. Aorta  dimensions suggest possible mild increase in dimension, however may vary by measurement  technique.  Summary: Patient is scheduled for coronary CT angiogram, consider ECG gated assessment of thoracic aorta during this exam, and retrospective gating to examine aortic valve morphology. _____________  Coronary CT 01/09/2019: Impression: 1. No evidence of CAD, CADRADS = 0. 2. Coronary calcium score of 0. This was 0 percentile for age and sex matched control. 3. Normal coronary origin with right dominance. 4. Trileaflet aortic valve with normal leaflet excursion. 5. Mildly dilated sinuses of Valsalva, 43 mm from L-Non coronary cusp. Mid ascending aorta measures 39 mm. 6. Moderately dilated left ventricle, end diastolic dimension 60 mm. End systolic dimension 43 mm.   History of Present Illness     Mr. Richard Meza is a 38 year old male with a history of paroxysmal SVT and aortic insufficiency who is followed by Dr. Eden EmmsNishan and Dr. Johney FrameAllred. Patient has been prescribed Lopressor 12.5mg  to be taken as need for SVT in the past. He states that he only typically has symptoms like this about 2 or 3 times per year and it is typically not too bothersome for him.   Patient presented to the ED on 01/09/2019 for evaluation of palpitations and chest pain. He reports he developed his typical symptoms of palpitations while driving on the night prior to presentation; however, this time he also developed chest pressure and diaphoresis.  He has never had chest pressure before; however does think that he has had some diaphoresis on prior episodes of palpitations.  His symptoms lasted for approximately 20 to 30 minutes and improved but he feels as though they did not completely resolve.  After going home and having a period of rest, he again developed recurrent  symptoms of palpitations with chest pressure and diaphoresis.  He thus came to the ED for evaluation.  In the ED the patient was found to be in narrow complex  tachycardia on twelve-lead ECG.  He was given 12 mg of Adenosine x1 and his rhythm reverted to normal sinus.  Labs were checked and he was found to have an elevated troponin at 0.34.  He was thus admitted to Cardiology for further work-up and management.  Notably, the patient has an approximately 1 year history of smoking but quit several years ago.  He has no known history of diabetes or any other cardiovascular conditions.  He does not believe that any of his first-degree family members have any history of cardiovascular disease.  Hospital Course     Consultants: None.  Patient was admitted for further evaluation of paroxysmal SVT and elevated troponin as stated above. Troponin mildly elevated but flat at 0.34 >> 0.24. TSH normal. Potassium 3.9. Echocardiogram was ordered which showed LVEF of 55-60% with mildly dilated LV cavity with grossly normal wall motion. Given elevated troponin, patient underwent coronary CT to rule out NSTEMI/ACS. Coronary CT showed moderately dilated LV but no evidence of CAD.  Elevated enzymes due to demand ischemia secondary to SVT. Symptoms resolved with return of sinus rhythm. No additional episodes of SVT noted on telemetry. Will add back Lopressor 12.5mg  at discharge to be taken twice daily (patient previously took this on an as needed basis). Will arrange for follow-up with Dr. Johney FrameAllred.  Of note, echo and coronary CT showed mildly dilated ascending aorta at 39mm as well as mildly dilated sinuses of Valsalva. Will defer any follow-up imaging to primary Cardiologist.   Patient had mild leukocytosis with WBC of 11.2 on admission. COVID-19 testing negative. No signs of infection. Repeat CBC this morning showed WBC of 7.1.   Patient seen and examined by Dr. Herbie BaltimoreHarding today and determined to be stable for discharge. Outpatient follow-up has been arranged. Medications as below.   _____________  Discharge Vitals Blood pressure 118/73, pulse 64, temperature (!) 97.5 F  (36.4 C), temperature source Oral, resp. rate 18, height 5\' 5"  (1.651 m), weight 87.2 kg, SpO2 99 %.  Filed Weights   01/09/19 0631  Weight: 87.2 kg    Labs & Radiologic Studies    CBC Recent Labs    01/09/19 0205 01/10/19 0441  WBC 11.2* 7.1  HGB 14.2 13.2  HCT 45.0 41.2  MCV 72.7* 72.9*  PLT 300 221   Basic Metabolic Panel Recent Labs    78/29/5606/15/20 0205  NA 139  K 3.9  CL 106  CO2 22  GLUCOSE 125*  BUN 15  CREATININE 1.01  CALCIUM 9.6   Liver Function Tests No results for input(s): AST, ALT, ALKPHOS, BILITOT, PROT, ALBUMIN in the last 72 hours. No results for input(s): LIPASE, AMYLASE in the last 72 hours. Cardiac Enzymes Recent Labs    01/09/19 0205 01/09/19 0615  TROPONINI 0.34* 0.24*   BNP Invalid input(s): POCBNP D-Dimer No results for input(s): DDIMER in the last 72 hours. Hemoglobin A1C Recent Labs    01/09/19 0205  HGBA1C 5.3   DUPLICATE REQUEST   Fasting Lipid Panel Recent Labs    01/09/19 0615  CHOL 151  HDL 29*  LDLCALC 87  TRIG 213174*  CHOLHDL 5.2   Thyroid Function Tests Recent Labs    01/09/19 0714  TSH 0.545   _____________  Ct Angio Chest Pe W And/or Wo Contrast  Result Date: 01/09/2019 CLINICAL DATA:  Chest pain, shortness of breath, tachycardia. EXAM: CT ANGIOGRAPHY CHEST WITH CONTRAST TECHNIQUE: Multidetector CT imaging of the chest was performed using the standard protocol during bolus administration of intravenous contrast. Multiplanar CT image reconstructions and MIPs were obtained to evaluate the vascular anatomy. CONTRAST:  75mL OMNIPAQUE IOHEXOL 350 MG/ML SOLN COMPARISON:  Chest radiograph 01/09/2019 FINDINGS: Cardiovascular: Pulmonary arterial opacification is adequate without evidence of emboli. There is cardiomegaly with notable left ventricular enlargement. No pericardial effusion. Normal caliber of the thoracic aorta. Mediastinum/Nodes: No enlarged axillary, mediastinal, or hilar lymph nodes. Collapsed esophagus.  Grossly unremarkable included thyroid within limitations of streak artifact. Lungs/Pleura: No pleural effusion or pneumothorax. Suture line in the right middle lobe along the minor fissure. Mild dependent atelectasis in the lower lobes. Upper Abdomen: Unremarkable. Musculoskeletal: No acute osseous abnormality or suspicious osseous lesion. Review of the MIP images confirms the above findings. IMPRESSION: 1. No evidence of pulmonary emboli or other acute abnormality in the chest. 2. Cardiomegaly. Electronically Signed   By: Sebastian AcheAllen  Grady M.D.   On: 01/09/2019 06:03   Ct Coronary Morph W/cta Cor W/score W/ca W/cm &/or Wo/cm  Addendum Date: 01/10/2019   ADDENDUM REPORT: 01/10/2019 08:32 EXAM: OVER-READ INTERPRETATION  CT CHEST The following report is an over-read performed by radiologist Dr. Schuyler Amoraylor [facility] on 01/10/2019. This over-read does not include interpretation of cardiac or coronary anatomy or pathology. The coronary calcium score and coronary CTA interpretation by the cardiologist is attached. COMPARISON:  01/09/2019 FINDINGS: Heart size appears enlarged. There is no pericardial effusion identified. The trachea appears patent and is midline. Normal appearance of the esophagus. No mediastinal mass. No mediastinal or hilar adenopathy. No pleural effusion. No pneumothorax or airspace consolidation. Subsegmental atelectasis noted in the left base. Postoperative change within the right midlung identified. No acute abnormality. No aggressive lytic or sclerotic bone lesions. IMPRESSION: 1. Cardiac enlargement. Electronically Signed   By: Signa Kellaylor  Stroud M.D.   On: 01/10/2019 08:32   Result Date: 01/10/2019 HISTORY: Chest pain, possible bicuspid aortic valve, aortic regurgitation EXAM: Cardiac/Coronary  CT TECHNIQUE: The patient was scanned on a [employer] Force scanner. PROTOCOL: A 120 kV prospective scan was triggered in the descending thoracic aorta at 111 HU's. Axial non-contrast 3 mm  slices were carried out through the heart. The data set was analyzed on a dedicated work station and scored using the Agatson method. Gantry rotation speed was 250 msecs and collimation was .6 mm. Beta blockade and 0.8 mg of sl NTG was given. The 3D data set was reconstructed in 5% intervals of the 67-82 % of the R-R cycle. Diastolic phases were analyzed on a dedicated work station using MPR, MIP and VRT modes. The patient received 90mL OMNIPAQUE IOHEXOL 350 MG/ML SOLN of contrast. FINDINGS: Coronary calcium score: The patient's coronary artery calcium score is 0 Coronary arteries: Normal coronary origins.  Right dominance. Right Coronary Artery: No detectable plaque or stenosis. Left Main Coronary Artery: No detectable plaque or stenosis. Left Anterior Descending Coronary Artery: No detectable plaque or stenosis. Left Circumflex Artery: No detectable plaque or stenosis. Aorta: No calcifications. No dissection. No coarctation of the aorta. Conventional 3 vessel branch pattern of the aortic arch. Normal takeoff of the left subclavian artery. Measurements of Aorta made double oblique: Sinuses of Valsalva: R-L: 41 mm, R-Non: 41 mm, L-Non: 43 mm ST Junction: 35 mm Mid ascending aorta (level of PA bifurcation): 39 mm Distal ascending aorta (proximal to right brachiocephalic artery): 37 mm Arch (between left common carotid and  left subclavian): 30 mm Proximal descending aorta: 33 mm Mid descending aorta (at level of pulmonary veins): 26 mm Distal descending aorta (just above aortic hiatus): 26 mm Aortic Valve: Trileaflet (see series 489 for representative image). No calcifications. Normal leaflet excursion. There may be a very tiny amount of fusion between right and left coronary cusps, possibly signifying a form fruste bicuspid aortic valve, however this is not definite. Valve appears to be functionally trileaflet. Other findings: Normal pulmonary vein drainage into the left atrium. Normal left atrial appendage without a  thrombus. Normal size of the pulmonary artery. Two papillary muscles are seen in LV, mitral valve is within normal limits. Normal biventricular function. LV end diastolic dimension: 60 mm, moderately dilated. LV end systolic dimension: 43 mm. IMPRESSION: 1. No evidence of CAD, CADRADS = 0. 2. Coronary calcium score of 0. This was 0 percentile for age and sex matched control. 3. Normal coronary origin with right dominance. 4.  Trileaflet aortic valve with normal leaflet excursion. 5. Mildly dilated sinuses of Valsalva, 43 mm from L-Non coronary cusp. Mid ascending aorta measures 39 mm. 6. Moderately dilated left ventricle, end diastolic dimension 60 mm. End systolic dimension 43 mm. Electronically Signed: By: Cherlynn Kaiser On: 01/09/2019 19:39   Dg Chest Portable 1 View  Result Date: 01/09/2019 CLINICAL DATA:  Chest pain EXAM: PORTABLE CHEST 1 VIEW COMPARISON:  09/25/2018 FINDINGS: Heart is borderline enlarged. Lungs clear. No effusions or edema. No acute bony abnormality. IMPRESSION: No active disease. Electronically Signed   By: Rolm Baptise M.D.   On: 01/09/2019 02:22   Disposition   Patient is being discharged home today in good condition.  Follow-up Plans & Appointments    Follow-up Information    Thompson Grayer, MD Follow up.   Specialty: Cardiology Why: You have a follow-up visit with Dr. Rayann Heman scheduled for 02/01/2019 at 10:45am. This is scheduled as a virtual visit (video visit). Please see discharge paperwork for more information. Contact information: [place] [address] [phone]          Discharge Instructions    Diet - low sodium heart healthy   Complete by: As directed    Increase activity slowly   Complete by: As directed       Discharge Medications   Allergies as of 01/10/2019   No Known Allergies     Medication List    STOP taking these medications   doxycycline 100 MG capsule Commonly known as: VIBRAMYCIN     TAKE these  medications   aspirin 325 MG tablet Take 650 mg by mouth every 6 (six) hours as needed for mild pain or moderate pain.   ibuprofen 200 MG tablet Commonly known as: ADVIL Take 400 mg by mouth every 6 (six) hours as needed for mild pain or moderate pain.   metoprolol tartrate 25 MG tablet Commonly known as: LOPRESSOR Take 0.5 tablets (12.5 mg total) by mouth 2 (two) times daily.        Acute coronary syndrome (MI, NSTEMI, STEMI, etc) this admission?:  No.  The elevated Troponin was due to the acute medical illness or demand ischemia.    Outstanding Labs/Studies   N/A.  Duration of Discharge Encounter   Greater than 30 minutes including physician time.  Signed, Darreld Mclean, PA-C 01/10/2019, 11:40 AM

## 2019-01-30 ENCOUNTER — Telehealth: Payer: Self-pay

## 2019-01-30 NOTE — Telephone Encounter (Signed)
Left message regarding appt on 02/01/19. 

## 2019-02-01 ENCOUNTER — Telehealth (INDEPENDENT_AMBULATORY_CARE_PROVIDER_SITE_OTHER): Payer: 59 | Admitting: Internal Medicine

## 2019-02-01 ENCOUNTER — Telehealth: Payer: Self-pay

## 2019-02-01 ENCOUNTER — Encounter: Payer: Self-pay | Admitting: Internal Medicine

## 2019-02-01 ENCOUNTER — Other Ambulatory Visit: Payer: Self-pay

## 2019-02-01 VITALS — BP 123/69 | HR 74 | Ht 65.0 in | Wt 189.0 lb

## 2019-02-01 DIAGNOSIS — I471 Supraventricular tachycardia, unspecified: Secondary | ICD-10-CM

## 2019-02-01 DIAGNOSIS — I351 Nonrheumatic aortic (valve) insufficiency: Secondary | ICD-10-CM

## 2019-02-01 NOTE — Telephone Encounter (Signed)
Call placed to Pt.  Call went to VM.  Left message requesting call back to schedule procedure.

## 2019-02-01 NOTE — Telephone Encounter (Signed)
-----   Message from Thompson Grayer, MD sent at 02/01/2019 12:03 PM EDT ----- Please cal and schedule SVT ablation with C/I/A Follow-up with me 4 weeks after  Also needs to follow up with general cardiology (perhaps [facility]) for aortic insufficiency.

## 2019-02-01 NOTE — Progress Notes (Signed)
Marland Kitchen     Electrophysiology TeleHealth Note   Due to national recommendations of social distancing due to [place] 19, an audio telehealth visit is felt to be most appropriate for this patient at this time.  Verbal consent was obtained by me for the telehealth visit today.  The patient does not have capability for a virtual visit.  A phone visit is therefore required today.   Date:  02/01/2019   ID:  Richard Meza, DOB May 06, 1981, MRN [id number]  Location: patient's home  Provider location:  [facility]  Evaluation Performed: Follow-up visit  PCP:  [facility] [place]   Electrophysiologist:  Dr Rayann Heman  Chief Complaint:  palpitations  History of Present Illness:    Richard Meza is a 38 y.o. male who presents via telehealth conferencing today. He is referred by Dr Ellyn Hack for EP consultation regarding SVT.   I saw him last in 2016 for SVT at which time he opted for medical therapy.  He did reasonably well with 3-4 episodes per year.  Most recently, he presented to the hospital with abrupt onset SVT 01/09/2019 with abrupt onset of SVT.  This was documented on EKG.  He has associated chest pain.   Today, he denies symptoms of shortness of breath,  lower extremity edema, dizziness, presyncope, or syncope.  The patient is otherwise without complaint today.  The patient denies symptoms of fevers, chills, cough, or new SOB worrisome for COVID 19.  Past Medical History:  Diagnosis Date  . SVT (supraventricular tachycardia) ([place])    documented short RP SVT    Past Surgical History:  Procedure Laterality Date  . LUNG SURGERY      Current Outpatient Medications  Medication Sig Dispense Refill  . metoprolol tartrate (LOPRESSOR) 25 MG tablet Take 0.5 tablets (12.5 mg total) by mouth 2 (two) times daily. 30 tablet 2   No current facility-administered medications for this visit.     Allergies:   Patient has no known allergies.   Social History:  The patient  reports that  he has never smoked. He has never used smokeless tobacco. He reports that he does not drink alcohol or use drugs.   Family History:  The patient's family history includes Alcoholism in his father; Other in his mother.   ROS:  Please see the history of present illness.   All other systems are personally reviewed and negative.    Exam:    Vital Signs:  BP 123/69   Pulse 74   Ht 5\' 5"  (1.651 m)   Wt 189 lb (85.7 kg)   BMI 31.45 kg/m   Well sounding today   Labs/Other Tests and Data Reviewed:    Recent Labs: 01/09/2019: BUN 15; Creatinine, Ser 1.01; Potassium 3.9; Sodium 139; TSH 0.545 01/10/2019: Hemoglobin 13.2; Platelets 221   Wt Readings from Last 3 Encounters:  02/01/19 189 lb (85.7 kg)  01/09/19 192 lb 3.9 oz (87.2 kg)  07/20/17 185 lb (83.9 kg)    Echo 01/09/2019 reviewed and normal; Records from June hospital visit reviewed  Cardiac CT 01/09/2019 reviewed and reveals no CAD  ekg 01/09/2019 reveals short RP SVT.  ASSESSMENT & PLAN:    1.  Short RP SVT Documented on ekg 01/09/2019 which terminated with adenosine. I saw him last in 2016.  He opted for medical therapy at that time. Therapeutic strategies for supraventricular tachycardia including medicine and ablation were discussed in detail with the patient today. Risk, benefits, and alternatives to EP study  and radiofrequency ablation were also discussed in detail today. These risks include but are not limited to stroke, bleeding, vascular damage, tamponade, perforation, damage to the heart and other structures, AV block requiring pacemaker, worsening renal function, and death. The patient understands these risk and wishes to proceed.  We will therefore proceed with catheter ablation at the next available time. Carto, ICE and anesthesia  2. Moderate AI with LV enlargement Will need cardiology follow-up long term    Patient Risk:  after full review of this patients clinical status, I feel that they are at moderate risk at  this time.  Today, I have spent 25 minutes with the patient with telehealth technology discussing arrhythmia management .    Randolm IdolSigned, Kenyatta Gloeckner, MD  02/01/2019 12:01 PM     [facility] [address] [place] [address] [phone] (office) [phone] (fax)

## 2019-02-02 NOTE — Telephone Encounter (Signed)
Pt would like March 09, 2019 2nd case

## 2019-02-20 NOTE — Telephone Encounter (Signed)
Work up complete.  Scheduled for labs/covid test august 10  Mailing instruction letter today.  Left detailed message

## 2019-03-06 ENCOUNTER — Other Ambulatory Visit: Payer: 59 | Admitting: *Deleted

## 2019-03-06 ENCOUNTER — Other Ambulatory Visit: Payer: Self-pay | Admitting: *Deleted

## 2019-03-06 ENCOUNTER — Other Ambulatory Visit: Payer: Self-pay

## 2019-03-06 ENCOUNTER — Other Ambulatory Visit (HOSPITAL_COMMUNITY)
Admission: RE | Admit: 2019-03-06 | Discharge: 2019-03-06 | Disposition: A | Discharge status: Home / Self Care | Payer: 59 | Source: Ambulatory Visit | Attending: Internal Medicine | Admitting: Internal Medicine

## 2019-03-06 ENCOUNTER — Other Ambulatory Visit: Payer: Self-pay | Admitting: Internal Medicine

## 2019-03-06 DIAGNOSIS — I471 Supraventricular tachycardia: Secondary | ICD-10-CM | POA: Diagnosis not present

## 2019-03-06 DIAGNOSIS — Z01812 Encounter for preprocedural laboratory examination: Secondary | ICD-10-CM | POA: Diagnosis present

## 2019-03-06 DIAGNOSIS — Z20828 Contact with and (suspected) exposure to other viral communicable diseases: Secondary | ICD-10-CM | POA: Insufficient documentation

## 2019-03-06 LAB — CBC
Hematocrit: 43.2 % (ref 37.5–51.0)
Hemoglobin: 13.8 g/dL (ref 13.0–17.7)
MCH: 23 pg — ABNORMAL LOW (ref 26.6–33.0)
MCHC: 31.9 g/dL (ref 31.5–35.7)
MCV: 72 fL — ABNORMAL LOW (ref 79–97)
Platelets: 277 10*3/uL (ref 150–450)
RBC: 5.99 x10E6/uL — ABNORMAL HIGH (ref 4.14–5.80)
RDW: 13.3 % (ref 11.6–15.4)
WBC: 7.7 10*3/uL (ref 3.4–10.8)

## 2019-03-06 LAB — SARS CORONAVIRUS 2 (TAT 6-24 HRS): SARS Coronavirus 2: NEGATIVE

## 2019-03-06 LAB — BASIC METABOLIC PANEL
BUN/Creatinine Ratio: 18 (ref 9–20)
BUN: 16 mg/dL (ref 6–20)
CO2: 21 mmol/L (ref 20–29)
Calcium: 9.3 mg/dL (ref 8.7–10.2)
Chloride: 102 mmol/L (ref 96–106)
Creatinine, Ser: 0.91 mg/dL (ref 0.76–1.27)
GFR calc Af Amer: 122 mL/min/{1.73_m2} (ref >59)
GFR calc non Af Amer: 106 mL/min/{1.73_m2} (ref >59)
Glucose: 92 mg/dL (ref 65–99)
Potassium: 4.1 mmol/L (ref 3.5–5.2)
Sodium: 138 mmol/L (ref 134–144)

## 2019-03-09 ENCOUNTER — Ambulatory Visit (HOSPITAL_COMMUNITY)
Admission: RE | Admit: 2019-03-09 | Discharge: 2019-03-09 | Disposition: A | Discharge status: Home / Self Care | Payer: 59 | Attending: Internal Medicine | Admitting: Internal Medicine

## 2019-03-09 ENCOUNTER — Ambulatory Visit (HOSPITAL_COMMUNITY): Payer: 59 | Admitting: Certified Registered"

## 2019-03-09 ENCOUNTER — Other Ambulatory Visit: Payer: Self-pay

## 2019-03-09 ENCOUNTER — Encounter (HOSPITAL_COMMUNITY)
Admission: RE | Disposition: A | Discharge status: Home / Self Care | Payer: Self-pay | Source: Home / Self Care | Attending: Internal Medicine

## 2019-03-09 ENCOUNTER — Encounter (HOSPITAL_COMMUNITY): Payer: Self-pay | Admitting: Certified Registered"

## 2019-03-09 DIAGNOSIS — I471 Supraventricular tachycardia: Secondary | ICD-10-CM | POA: Insufficient documentation

## 2019-03-09 DIAGNOSIS — Z79899 Other long term (current) drug therapy: Secondary | ICD-10-CM | POA: Diagnosis not present

## 2019-03-09 HISTORY — PX: SVT ABLATION: EP1225

## 2019-03-09 SURGERY — SVT ABLATION
Anesthesia: Monitor Anesthesia Care

## 2019-03-09 MED ORDER — SODIUM CHLORIDE 0.9 % IV SOLN: 250.0000 mL | INTRAVENOUS | Status: DC | PRN

## 2019-03-09 MED ORDER — ISOPROTERENOL HCL 0.2 MG/ML IJ SOLN
INTRAVENOUS | Status: DC | PRN
  Administered 2019-03-09: 4 ug/min via INTRAVENOUS

## 2019-03-09 MED ORDER — BUPIVACAINE HCL (PF) 0.25 % IJ SOLN
INTRAMUSCULAR | Status: DC | PRN
  Administered 2019-03-09: 30 mL

## 2019-03-09 MED ORDER — SODIUM CHLORIDE 0.9 % IV SOLN
INTRAVENOUS | Status: DC
  Administered 2019-03-09: 10:00:00 via INTRAVENOUS

## 2019-03-09 MED ORDER — HYDROCODONE-ACETAMINOPHEN 5-325 MG PO TABS: 1.0000 | ORAL_TABLET | ORAL | Status: DC | PRN

## 2019-03-09 MED ORDER — ONDANSETRON HCL 4 MG/2ML IJ SOLN
INTRAMUSCULAR | Status: DC | PRN
  Administered 2019-03-09: 4 mg via INTRAVENOUS

## 2019-03-09 MED ORDER — ISOPROTERENOL HCL 0.2 MG/ML IJ SOLN
INTRAMUSCULAR | Status: AC
  Filled 2019-03-09: qty 5

## 2019-03-09 MED ORDER — ACETAMINOPHEN 325 MG PO TABS: 650.0000 mg | ORAL_TABLET | ORAL | Status: DC | PRN

## 2019-03-09 MED ORDER — SODIUM CHLORIDE 0.9% FLUSH: 3.0000 mL | Freq: Two times a day (BID) | INTRAVENOUS | Status: DC

## 2019-03-09 MED ORDER — FENTANYL CITRATE (PF) 100 MCG/2ML IJ SOLN
INTRAMUSCULAR | Status: DC | PRN
  Administered 2019-03-09: 50 ug via INTRAVENOUS

## 2019-03-09 MED ORDER — PROPOFOL 500 MG/50ML IV EMUL
INTRAVENOUS | Status: DC | PRN
  Administered 2019-03-09: 100 ug/kg/min via INTRAVENOUS

## 2019-03-09 MED ORDER — PROPOFOL 10 MG/ML IV BOLUS
INTRAVENOUS | Status: DC | PRN
  Administered 2019-03-09: 20 mg via INTRAVENOUS

## 2019-03-09 MED ORDER — LIDOCAINE 2% (20 MG/ML) 5 ML SYRINGE
INTRAMUSCULAR | Status: DC | PRN
  Administered 2019-03-09: 40 mg via INTRAVENOUS

## 2019-03-09 MED ORDER — SODIUM CHLORIDE 0.9 % IV SOLN
INTRAVENOUS | Status: DC | PRN
  Administered 2019-03-09: 10 ug/min via INTRAVENOUS

## 2019-03-09 MED ORDER — ONDANSETRON HCL 4 MG/2ML IJ SOLN: 4.0000 mg | Freq: Four times a day (QID) | INTRAMUSCULAR | Status: DC | PRN

## 2019-03-09 MED ORDER — BUPIVACAINE HCL (PF) 0.25 % IJ SOLN
INTRAMUSCULAR | Status: AC
  Filled 2019-03-09: qty 30

## 2019-03-09 MED ORDER — SODIUM CHLORIDE 0.9% FLUSH: 3.0000 mL | INTRAVENOUS | Status: DC | PRN

## 2019-03-09 MED ORDER — MIDAZOLAM HCL 2 MG/2ML IJ SOLN
INTRAMUSCULAR | Status: DC | PRN
  Administered 2019-03-09: 2 mg via INTRAVENOUS

## 2019-03-09 SURGICAL SUPPLY — 10 items
CATH EZ STEER NAV 4MM D-F CUR (ABLATOR) ×3 IMPLANT
CATH JOSEPH QUAD ALLRED 6F REP (CATHETERS) ×6 IMPLANT
CATH WEBSTER BI DIR CS D-F CRV (CATHETERS) ×3 IMPLANT
PACK EP LATEX FREE (CUSTOM PROCEDURE TRAY) ×2
PACK EP LF (CUSTOM PROCEDURE TRAY) ×1 IMPLANT
PAD PRO RADIOLUCENT 2001M-C (PAD) ×3 IMPLANT
PATCH CARTO3 (PAD) ×3 IMPLANT
SHEATH PINNACLE 6F 10CM (SHEATH) ×3 IMPLANT
SHEATH PINNACLE 7F 10CM (SHEATH) ×3 IMPLANT
SHEATH PINNACLE 8F 10CM (SHEATH) ×3 IMPLANT

## 2019-03-09 NOTE — Anesthesia Preprocedure Evaluation (Addendum)
Anesthesia Evaluation  Patient identified by MRN, date of birth, ID band Patient awake    Reviewed: Allergy & Precautions, NPO status , Patient's Chart, lab work & pertinent test results  Airway Mallampati: I  TM Distance: >3 FB Neck ROM: Full    Dental  (+) Missing, Chipped, Loose,    Pulmonary    Pulmonary exam normal        Cardiovascular + Past MI  Normal cardiovascular exam     Neuro/Psych    GI/Hepatic   Endo/Other    Renal/GU      Musculoskeletal   Abdominal   Peds  Hematology   Anesthesia Other Findings   Reproductive/Obstetrics                            Anesthesia Physical Anesthesia Plan  ASA: III  Anesthesia Plan: General   Post-op Pain Management:    Induction: Intravenous  PONV Risk Score and Plan: 2 and Ondansetron and Treatment may vary due to age or medical condition  Airway Management Planned: LMA  Additional Equipment:   Intra-op Plan:   Post-operative Plan: Extubation in OR  Informed Consent: I have reviewed the patients History and Physical, chart, labs and discussed the procedure including the risks, benefits and alternatives for the proposed anesthesia with the patient or authorized representative who has indicated his/her understanding and acceptance.       Plan Discussed with: CRNA and Surgeon  Anesthesia Plan Comments:         Anesthesia Quick Evaluation

## 2019-03-09 NOTE — Progress Notes (Signed)
Right groin with small amt oozing noted and dressing changed and no further oozing noted

## 2019-03-09 NOTE — Anesthesia Procedure Notes (Signed)
Procedure Name: MAC Date/Time: 03/09/2019 10:37 AM Performed by: Barrington Ellison, CRNA Pre-anesthesia Checklist: Patient identified, Emergency Drugs available, Suction available, Patient being monitored and Timeout performed Patient Re-evaluated:Patient Re-evaluated prior to induction Oxygen Delivery Method: Simple face mask

## 2019-03-09 NOTE — H&P (Signed)
Chief Complaint:  palpitations  History of Present Illness:    Richard Meza is a 38 y.o. male who presents for EPS and RFA of SVT.   He presented to the hospital with abrupt onset SVT 01/09/2019 with abrupt onset of SVT.  This was documented on EKG.  He has associated chest pain.  he continues to have brief episodes since that time. Today, he denies symptoms of shortness of breath,  lower extremity edema, dizziness, presyncope, or syncope.  The patient is otherwise without complaint today.  The patient denies symptoms of fevers, chills, cough, or new SOB worrisome for COVID 19.      Past Medical History:  Diagnosis Date  . SVT (supraventricular tachycardia) ([place])    documented short RP SVT         Past Surgical History:  Procedure Laterality Date  . LUNG SURGERY            Current Outpatient Medications  Medication Sig Dispense Refill  . metoprolol tartrate (LOPRESSOR) 25 MG tablet Take 0.5 tablets (12.5 mg total) by mouth 2 (two) times daily. 30 tablet 2   No current facility-administered medications for this visit.     Allergies:   Patient has no known allergies.   Social History:  The patient  reports that he has never smoked. He has never used smokeless tobacco. He reports that he does not drink alcohol or use drugs.   Family History:  The patient's family history includes Alcoholism in his father; Other in his mother.   ROS:  Please see the history of present illness.   All other systems are personally reviewed and negative.   Physical Exam: Vitals:   03/09/19 0814  BP: 138/65  Pulse: 65  Resp: 14  Temp: 97.8 F (36.6 C)  TempSrc: Skin  SpO2: 99%  Weight: 84.8 kg  Height: 5\' 5"  (1.651 m)    GEN- The patient is well appearing, alert and oriented x 3 today.   Head- normocephalic, atraumatic Eyes-  Sclera clear, conjunctiva pink Ears- hearing intact Oropharynx- clear Neck- supple, Lungs- Clear to ausculation bilaterally, normal work of  breathing Heart- Regular rate and rhythm, no murmurs, rubs or gallops, PMI not laterally displaced GI- soft, NT, ND, + BS Extremities- no clubbing, cyanosis, or edema, groin is without hematoma/ bruit MS- no significant deformity or atrophy Skin- no rash or lesion Psych- euthymic mood, full affect Neuro- strength and sensation are intact   Labs/Other Tests and Data Reviewed:    Recent Labs: 01/09/2019: BUN 15; Creatinine, Ser 1.01; Potassium 3.9; Sodium 139; TSH 0.545 01/10/2019: Hemoglobin 13.2; Platelets 221      Wt Readings from Last 3 Encounters:  02/01/19 189 lb (85.7 kg)  01/09/19 192 lb 3.9 oz (87.2 kg)  07/20/17 185 lb (83.9 kg)    Echo 01/09/2019 reviewed and normal; Records from June hospital visit reviewed  Cardiac CT 01/09/2019 reviewed and reveals no CAD  ekg 01/09/2019 reveals short RP SVT.  ASSESSMENT & PLAN:    1.  Short RP SVT Documented on ekg 01/09/2019 which terminated with adenosine.  Therapeutic strategies for supraventricular tachycardia including medicine and ablation were discussed in detail with the patient today. Risk, benefits, and alternatives to EP study and radiofrequency ablation were also discussed in detail today. These risks include but are not limited to stroke, bleeding, vascular damage, tamponade, perforation, damage to the heart and other structures, AV block requiring pacemaker, worsening renal function, and death. The patient understands these risk and wishes  to proceed.     Hillis RangeJames Zayonna Ayuso MD, [facility] CenterFACC [facility] ServicesFHRS 03/09/2019 10:21 AM

## 2019-03-09 NOTE — Progress Notes (Addendum)
Site Area:  RFV x3 Site prior to removal: Level 0 Pressure applied for:  20 minutes Manual:  yes Pt status during pull:  stable Post pull site:  Level 0 Post pull instructions given:  yes Post pull pulses present:  DP Dressing applied:  Gauze/tegaderm Bedrest begins @:  1300 Comments: Removed by LB Marcelline Deist

## 2019-03-09 NOTE — Discharge Instructions (Signed)

## 2019-03-09 NOTE — Transfer of Care (Signed)
Immediate Anesthesia Transfer of Care Note  Patient: Richard Meza  Procedure(s) Performed: SVT ABLATION (N/A )  Patient Location: Cath Lab  Anesthesia Type:MAC  Level of Consciousness: awake, alert  and oriented  Airway & Oxygen Therapy: Patient Spontanous Breathing  Post-op Assessment: Report given to RN  Post vital signs: Reviewed and stable  Last Vitals:  Vitals Value Taken Time  BP    Temp    Pulse    Resp    SpO2      Last Pain:  Vitals:   03/09/19 0814  TempSrc: Skin         Complications: No apparent anesthesia complications

## 2019-03-09 NOTE — Anesthesia Postprocedure Evaluation (Addendum)
Anesthesia Post Note  Patient: Richard Meza  Procedure(s) Performed: SVT ABLATION (N/A )     Patient location during evaluation: PACU Anesthesia Type: MAC Level of consciousness: awake and alert Pain management: pain level controlled Vital Signs Assessment: post-procedure vital signs reviewed and stable Respiratory status: spontaneous breathing, nonlabored ventilation, respiratory function stable and patient connected to nasal cannula oxygen Cardiovascular status: stable and blood pressure returned to baseline Postop Assessment: no apparent nausea or vomiting Anesthetic complications: no    Last Vitals:  Vitals:   03/09/19 1313 03/09/19 1323  BP:  116/76  Pulse:  73  Resp:  19  Temp: 36.5 C   SpO2:  99%    Last Pain:  Vitals:   03/09/19 1324  TempSrc:   PainSc: 0-No pain                 Maher Shon DAVID

## 2019-03-10 ENCOUNTER — Encounter (HOSPITAL_COMMUNITY): Payer: Self-pay | Admitting: Internal Medicine

## 2019-03-12 ENCOUNTER — Encounter (HOSPITAL_COMMUNITY): Payer: Self-pay | Admitting: Internal Medicine

## 2019-03-12 NOTE — Addendum Note (Signed)
Addendum  created 03/12/19 1436 by Lillia Abed, MD   Clinical Note Signed, SmartForm saved

## 2019-04-10 ENCOUNTER — Telehealth (INDEPENDENT_AMBULATORY_CARE_PROVIDER_SITE_OTHER): Payer: 59 | Admitting: Internal Medicine

## 2019-04-10 ENCOUNTER — Inpatient Hospital Stay
Admission: RE | Admit: 2019-04-10 | Discharge: 2019-04-10 | Disposition: A | Discharge status: Home / Self Care | Payer: 59 | Source: Ambulatory Visit

## 2019-04-10 ENCOUNTER — Encounter: Payer: Self-pay | Admitting: Internal Medicine

## 2019-04-10 VITALS — Ht 65.0 in | Wt 187.0 lb

## 2019-04-10 DIAGNOSIS — I471 Supraventricular tachycardia: Secondary | ICD-10-CM | POA: Diagnosis not present

## 2019-04-10 NOTE — Progress Notes (Signed)
     Electrophysiology TeleHealth Note   Due to national recommendations of social distancing due to [place] 19, an audio telehealth visit is felt to be most appropriate for this patient at this time.  Verbal consent was obtained by me for the telehealth visit today.  The patient does not have capability for a virtual visit.  A phone visit is therefore required today.   Date:  04/10/2019   ID:  Richard Meza, DOB [birth year], MRN [id number]  Location: patient's home  Provider location:  [facility]  Evaluation Performed: Follow-up visit  PCP:  [facility] [place]   Electrophysiologist:  Dr Rayann Heman  Chief Complaint:  palpitations  History of Present Illness:    Richard Meza is a 38 y.o. male who presents via telehealth conferencing today.  Since his ablation, the patient reports doing very well.  He denies procedure related complications.  No symptoms of SVT.  Today, he denies symptoms of palpitations, chest pain, shortness of breath,  lower extremity edema, dizziness, presyncope, or syncope.  The patient is otherwise without complaint today.  The patient denies symptoms of fevers, chills, cough, or new SOB worrisome for COVID 19.  Past Medical History:  Diagnosis Date  . SVT (supraventricular tachycardia) ([place])    documented short RP SVT    Past Surgical History:  Procedure Laterality Date  . LUNG SURGERY    . SVT ABLATION N/A 03/09/2019   Procedure: SVT ABLATION;  Surgeon: Thompson Grayer, MD;  Location: [facility];  Service: Cardiovascular;  Laterality: N/A;    No current outpatient medications on file.   No current facility-administered medications for this visit.     Allergies:   Patient has no known allergies.   Social History:  The patient  reports that he has never smoked. He has never used smokeless tobacco. He reports that he does not drink alcohol or use drugs.   Family History:  The patient's family history includes Alcoholism in his  father; Other in his mother.   ROS:  Please see the history of present illness.   All other systems are personally reviewed and negative.    Exam:    Vital Signs:  Ht 5\' 5"  (1.651 m)   Wt 187 lb (84.8 kg)   BMI 31.12 kg/m   Well sounding, alert and conversant  Labs/Other Tests and Data Reviewed:    Recent Labs: 01/09/2019: TSH 0.545 03/06/2019: BUN 16; Creatinine, Ser 0.91; Hemoglobin 13.8; Platelets 277; Potassium 4.1; Sodium 138   Wt Readings from Last 3 Encounters:  04/10/19 187 lb (84.8 kg)  03/09/19 187 lb (84.8 kg)  02/01/19 189 lb (85.7 kg)     ASSESSMENT & PLAN:    1.  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  Today, I have spent 15 minutes with the patient with telehealth technology discussing arrhythmia management .    Army Fossa, MD  04/10/2019 3:48 PM     [place] [address] [place] [address] [phone] (office) [phone] (fax)
